# Patient Record
Sex: Female | Born: 1960 | Race: White | Hispanic: No | Marital: Married | Smoking: Never smoker
Health system: Southern US, Community
[De-identification: ages and names within clinical notes are randomized; demographics above are authoritative.]

## PROBLEM LIST (undated history)

## (undated) ENCOUNTER — Emergency Department: Discharge status: Absent without leave | Payer: Self-pay

## (undated) DIAGNOSIS — Z789 Other specified health status: Secondary | ICD-10-CM

## (undated) HISTORY — DX: Other specified health status: Z78.9

---
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
## 2020-02-02 ENCOUNTER — Emergency Department (INDEPENDENT_AMBULATORY_CARE_PROVIDER_SITE_OTHER)
Admission: EM | Admit: 2020-02-02 | Discharge: 2020-02-02 | Disposition: A | Discharge status: Home / Self Care | Payer: Managed Care, Other (non HMO) | Source: Home / Self Care

## 2020-02-02 ENCOUNTER — Emergency Department (INDEPENDENT_AMBULATORY_CARE_PROVIDER_SITE_OTHER): Payer: Managed Care, Other (non HMO)

## 2020-02-02 ENCOUNTER — Other Ambulatory Visit: Payer: Self-pay

## 2020-02-02 DIAGNOSIS — S92335A Nondisplaced fracture of third metatarsal bone, left foot, initial encounter for closed fracture: Secondary | ICD-10-CM

## 2020-02-02 DIAGNOSIS — W19XXXA Unspecified fall, initial encounter: Secondary | ICD-10-CM

## 2020-02-02 DIAGNOSIS — S82832A Other fracture of upper and lower end of left fibula, initial encounter for closed fracture: Secondary | ICD-10-CM

## 2020-02-02 DIAGNOSIS — S8265XA Nondisplaced fracture of lateral malleolus of left fibula, initial encounter for closed fracture: Secondary | ICD-10-CM | POA: Diagnosis not present

## 2020-02-02 NOTE — ED Triage Notes (Signed)
Patient presents to Urgent Care with complaints of left ankle pain since twisting it laterally and falling earlier today. Patient reports she is not sure what she stepped on, has been using her husband's cane to ambulate, feels like she heard something pop at the time of injury.

## 2020-02-02 NOTE — Discharge Instructions (Signed)
Take ibuprofen 400 mg every 6 hours as needed for pain. Follow-up with orthopedic at [facility] tomorrow at their walk in clinic.

## 2020-02-02 NOTE — ED Provider Notes (Addendum)
[facility]    CSN: [id number] Arrival date & time: 02/02/20  1458      History   Chief Complaint Chief Complaint  Patient presents with   Ankle Injury    Left    HPI Rhonda Wilkins is a 59 y.o. female.   HPI Patient presents for evaluation injury involving left ankle. Reports she that she walking and subsequently inverted her left ankle with a twisting action and subsequently sustained a fall. She has had worsening pain, swelling, with weight bearing. Ambulating with a cane.  No prior fracture involving the left lower extremity. History reviewed. No pertinent past medical history.  There are no problems to display for this patient.   History reviewed. No pertinent surgical history.  OB History   No obstetric history on file.      Home Medications    Prior to Admission medications   Not on File    Family History Family History  Problem Relation Age of Onset   Cancer Mother    Healthy Father     Social History Social History   Tobacco Use   Smoking status: Never Smoker   Smokeless tobacco: Never Used  Building services engineer Use: Never used  Substance Use Topics   Alcohol use: Not Currently   Drug use: Not on file     Allergies   Latex   Review of Systems Review of Systems Pertinent negatives listed in HPI Physical Exam Triage Vital Signs ED Triage Vitals  Enc Vitals Group     BP 02/02/20 1520 130/87     Pulse Rate 02/02/20 1520 97     Resp 02/02/20 1520 16     Temp 02/02/20 1520 98.7 F (37.1 C)     Temp Source 02/02/20 1520 Oral     SpO2 02/02/20 1520 99 %     Weight --      Height 02/02/20 1519 5\' 2"  (1.575 m)     Head Circumference --      Peak Flow --      Pain Score 02/02/20 1518 3     Pain Loc --      Pain Edu? --      Excl. in GC? --    No data found.  Updated Vital Signs BP 130/87 (BP Location: Right Arm)    Pulse 97    Temp 98.7 F (37.1 C) (Oral)    Resp 16    Ht 5\' 2"  (1.575 m)    SpO2 99%    Visual Acuity Right Eye Distance:   Left Eye Distance:   Bilateral Distance:    Right Eye Near:   Left Eye Near:    Bilateral Near:     Physical Exam General appearance: alert, well developed, well nourished, cooperative and in no distress Head: Normocephalic, without obvious abnormality, atraumatic Respiratory: Respirations even and unlabored, normal respiratory rate Heart: rate and rhythm normal. No gallop or murmurs noted on exam  Extremities: Left lower leg swelling, ecchymosis, tenderness  Skin: Skin color, texture, turgor normal. No rashes seen  Psych: Appropriate mood and affect. Neurologic: Mental status: Alert, oriented to person, place, and time, thought content appropriate.   UC Treatments / Results  Labs (all labs ordered are listed, but only abnormal results are displayed) Labs Reviewed - No data to display  EKG   Radiology DG Ankle Complete Left  Result Date: 02/02/2020 CLINICAL DATA:  Twisting injury to the left ankle today. Unable to bear weight. EXAM: LEFT  ANKLE COMPLETE - 3+ VIEW COMPARISON:  None. FINDINGS: Mildly comminuted fracture of the distal fibula, from the posterior distal shaft to the metaphysis, above the ankle synchondrosis and ankle joint. No other fractures. The ankle mortise is normally spaced and aligned. Lateral soft tissue swelling. IMPRESSION: 1. Nondisplaced, mildly comminuted fracture of the distal fibula as described. 2. Normally aligned ankle joint. Electronically Signed   By: Amie Portland M.D.   On: 02/02/2020 15:52   DG Foot Complete Left  Result Date: 02/02/2020 CLINICAL DATA:  Twisting injury to the left ankle today. Unable to bear weight. EXAM: LEFT FOOT - COMPLETE 3+ VIEW COMPARISON:  None. FINDINGS: Distal fibular fracture, described under the left ankle radiographs. On the oblique view, there is a nondisplaced, non comminuted fracture at the base of the third metatarsal. This appears extra-articular. No other fractures.  Joints  are normally spaced and aligned. Small plantar calcaneal spur. Soft tissues of the foot are unremarkable. IMPRESSION: 1. Nondisplaced, non comminuted fracture of the base of the third metatarsal, visualized on one view only, extra-articular. 2. Distal left fibular fractures as detailed under the left ankle radiographs. 3. No other fractures.  No dislocation. Electronically Signed   By: Amie Portland M.D.   On: 02/02/2020 15:54   Personally Reviewed Films, review with patient, Joaquin Courts, FNP-C   Procedures Procedures (including critical care time)  Medications Ordered in UC Medications - No data to display  Initial Impression / Assessment and Plan / UC Course  I have reviewed the triage vital signs and the nursing notes.  Pertinent labs & imaging results that were available during my care of the patient were reviewed by me and considered in my medical decision making (see chart for details).     Left distal fibula fracture and Left 3rd metatarsal fracture. Remain nonweightbearing. Placed in cam walker, crutches. Ice when out of boot. Wear boot with all weightbearing activities. Follow-up [facility] orthopedic walk in clinic tomorrow or further evaluation of distal fib fracture. Ibuprofen for pain. Pt declined pain medicine. Final Clinical Impressions(s) / UC Diagnoses   Final diagnoses:  Closed nondisplaced fracture of third metatarsal bone of left foot, initial encounter  Other closed fracture of distal end of left fibula, initial encounter     Discharge Instructions     Take ibuprofen 400 mg every 6 hours as needed for pain. Follow-up with orthopedic at [facility] tomorrow at their walk in clinic.    ED Prescriptions    None     PDMP not reviewed this encounter.   Bing Neighbors, FNP 02/04/20 1404    Bing Neighbors, FNP 02/04/20 1407    Bing Neighbors, FNP 02/06/20 1255

## 2020-02-04 ENCOUNTER — Other Ambulatory Visit: Payer: Self-pay

## 2020-02-04 ENCOUNTER — Ambulatory Visit (INDEPENDENT_AMBULATORY_CARE_PROVIDER_SITE_OTHER): Payer: Managed Care, Other (non HMO)

## 2020-02-04 ENCOUNTER — Ambulatory Visit (INDEPENDENT_AMBULATORY_CARE_PROVIDER_SITE_OTHER): Payer: Managed Care, Other (non HMO) | Admitting: Sports Medicine

## 2020-02-04 DIAGNOSIS — S92335A Nondisplaced fracture of third metatarsal bone, left foot, initial encounter for closed fracture: Secondary | ICD-10-CM | POA: Diagnosis not present

## 2020-02-04 DIAGNOSIS — W19XXXA Unspecified fall, initial encounter: Secondary | ICD-10-CM

## 2020-02-04 DIAGNOSIS — S8292XA Unspecified fracture of left lower leg, initial encounter for closed fracture: Secondary | ICD-10-CM

## 2020-02-04 DIAGNOSIS — S8265XA Nondisplaced fracture of lateral malleolus of left fibula, initial encounter for closed fracture: Secondary | ICD-10-CM | POA: Diagnosis not present

## 2020-02-04 MED ORDER — CALCIUM CARBONATE-VITAMIN D 600-400 MG-UNIT PO TABS: 1.0000 | ORAL_TABLET | Freq: Two times a day (BID) | ORAL | 11 refills | Status: DC

## 2020-02-04 MED ORDER — HYDROCODONE-ACETAMINOPHEN 5-325 MG PO TABS: 1.0000 | ORAL_TABLET | Freq: Three times a day (TID) | ORAL | 0 refills | Status: DC | PRN

## 2020-02-04 NOTE — Assessment & Plan Note (Addendum)
Nondisplaced fracture of the left third metatarsal base, comminuted fracture, nondisplaced of the left distal fibular shaft, continue boot. I discussed the physiology of bone healing. Ibuprofen seems to be working well for her, continue strict nonweightbearing with 2 crutches. X-rays today, x-rays at a 1 week follow-up visit. Once I see bony callus we can space out the imaging, and once she is no longer tender over the fracture we can start partial weightbearing as tolerated. We do expect two to 2-1/2 months for full healing of this severe injury. Hydrocodone for pain.

## 2020-02-04 NOTE — Addendum Note (Signed)
Addended by: Monica Becton on: 02/04/2020 02:48 PM   Modules accepted: Orders

## 2020-02-04 NOTE — Progress Notes (Addendum)
    Procedures performed today:    None.  Independent interpretation of notes and tests performed by another provider:   X-rays personally viewed, there is a nondisplaced fracture of the left third metatarsal base, comminuted fracture, nondisplaced of the left distal fibular shaft.  Brief History, Exam, Impression, and Recommendations:    Nondisplaced fracture of the left third metatarsal base, comminuted fracture, nondisplaced of the left distal fibular shaft Nondisplaced fracture of the left third metatarsal base, comminuted fracture, nondisplaced of the left distal fibular shaft, continue boot. I discussed the physiology of bone healing. Ibuprofen seems to be working well for her, continue strict nonweightbearing with 2 crutches. X-rays today, x-rays at a 1 week follow-up visit. Once I see bony callus we can space out the imaging, and once she is no longer tender over the fracture we can start partial weightbearing as tolerated. We do expect two to 2-1/2 months for full healing of this severe injury. Hydrocodone for pain.    ___________________________________________ [name]. [name], M.D., ABFM., CAQSM. Primary Care and Sports Medicine [facility] [facility]  Adjunct Instructor of Family Medicine  [employer]

## 2020-02-06 ENCOUNTER — Telehealth: Payer: Self-pay

## 2020-02-06 NOTE — Telephone Encounter (Signed)
Patient recently broke her ankle and foot and is walking on crutches.  She reports pulling a muscle in her ribs and is requesting a "wrap" to help this.  She is willing to come to the office to get something.  Please advise.

## 2020-02-07 ENCOUNTER — Ambulatory Visit (INDEPENDENT_AMBULATORY_CARE_PROVIDER_SITE_OTHER): Payer: Managed Care, Other (non HMO) | Admitting: Sports Medicine

## 2020-02-07 ENCOUNTER — Other Ambulatory Visit: Payer: Self-pay

## 2020-02-07 DIAGNOSIS — S8292XA Unspecified fracture of left lower leg, initial encounter for closed fracture: Secondary | ICD-10-CM

## 2020-02-07 DIAGNOSIS — S39011A Strain of muscle, fascia and tendon of abdomen, initial encounter: Secondary | ICD-10-CM | POA: Insufficient documentation

## 2020-02-07 MED ORDER — AMBULATORY NON FORMULARY MEDICATION: 0 refills | Status: DC

## 2020-02-07 NOTE — Assessment & Plan Note (Signed)
Rhonda Wilkins has been using crutches, she has developed pain just inferior to her right costal margin along her obliques. I think she likely just has an oblique strain. Her abdominal exam is unremarkable. Adding a rolling knee scooter instead of crutches, keep previously agreed upon follow-up.

## 2020-02-07 NOTE — Telephone Encounter (Signed)
Patient has an appointment with Dr [name] today.

## 2020-02-07 NOTE — Progress Notes (Signed)
    Procedures performed today:    None.  Independent interpretation of notes and tests performed by another provider:   None.  Brief History, Exam, Impression, and Recommendations:    Strain of abdominal muscle Rhonda Wilkins has been using crutches, she has developed pain just inferior to her right costal margin along her obliques. I think she likely just has an oblique strain. Her abdominal exam is unremarkable. Adding a rolling knee scooter instead of crutches, keep previously agreed upon follow-up.    ___________________________________________ [name]. [name], M.D., ABFM., CAQSM. Primary Care and Sports Medicine [place] [facility]  Adjunct Instructor of Family Medicine  [employer]

## 2020-02-11 ENCOUNTER — Ambulatory Visit (INDEPENDENT_AMBULATORY_CARE_PROVIDER_SITE_OTHER): Payer: Managed Care, Other (non HMO)

## 2020-02-11 ENCOUNTER — Other Ambulatory Visit: Payer: Self-pay

## 2020-02-11 ENCOUNTER — Ambulatory Visit (INDEPENDENT_AMBULATORY_CARE_PROVIDER_SITE_OTHER): Payer: Managed Care, Other (non HMO) | Admitting: Sports Medicine

## 2020-02-11 DIAGNOSIS — S39011D Strain of muscle, fascia and tendon of abdomen, subsequent encounter: Secondary | ICD-10-CM

## 2020-02-11 DIAGNOSIS — S8292XA Unspecified fracture of left lower leg, initial encounter for closed fracture: Secondary | ICD-10-CM

## 2020-02-11 DIAGNOSIS — S8265XD Nondisplaced fracture of lateral malleolus of left fibula, subsequent encounter for closed fracture with routine healing: Secondary | ICD-10-CM | POA: Diagnosis not present

## 2020-02-11 NOTE — Assessment & Plan Note (Signed)
Rhonda Wilkins returns, she is a pleasant 59 year old female, approximately 1 week post comminuted fracture of her fibular shaft as well as a nondisplaced fracture of her left third metatarsal base, she is doing well in a boot, still has significant pain over the fracture, although improved. She did need to place a sock on her heel and this took off some pressure over her fibular fracture, we will add a heel lift to help this. I had like to see her back in about 2 weeks, this will take her to about 3 weeks post injury, x-ray before visit.

## 2020-02-11 NOTE — Assessment & Plan Note (Signed)
Due to walking with crutches, we switched to a rolling knee scooter and this has improved considerably.

## 2020-02-11 NOTE — Progress Notes (Signed)
    Procedures performed today:    None.  Independent interpretation of notes and tests performed by another provider:   X-rays personally reviewed, comminuted fracture of the fibular shaft is still visible but stable.  Brief History, Exam, Impression, and Recommendations:    Nondisplaced fracture of the left third metatarsal base, comminuted fracture, nondisplaced of the left distal fibular shaft Rhonda Wilkins returns, she is a pleasant 59 year old female, approximately 1 week post comminuted fracture of her fibular shaft as well as a nondisplaced fracture of her left third metatarsal base, she is doing well in a boot, still has significant pain over the fracture, although improved. She did need to place a sock on her heel and this took off some pressure over her fibular fracture, we will add a heel lift to help this. I had like to see her back in about 2 weeks, this will take her to about 3 weeks post injury, x-ray before visit.  Strain of abdominal muscle Due to walking with crutches, we switched to a rolling knee scooter and this has improved considerably.    ___________________________________________ [name]. [name], M.D., ABFM., CAQSM. Primary Care and Sports Medicine [place] [facility]  Adjunct Instructor of Family Medicine  [employer]

## 2020-02-25 ENCOUNTER — Ambulatory Visit (INDEPENDENT_AMBULATORY_CARE_PROVIDER_SITE_OTHER): Payer: Managed Care, Other (non HMO)

## 2020-02-25 ENCOUNTER — Encounter: Payer: Self-pay | Admitting: Sports Medicine

## 2020-02-25 ENCOUNTER — Ambulatory Visit (INDEPENDENT_AMBULATORY_CARE_PROVIDER_SITE_OTHER): Payer: Managed Care, Other (non HMO) | Admitting: Sports Medicine

## 2020-02-25 ENCOUNTER — Other Ambulatory Visit: Payer: Self-pay

## 2020-02-25 DIAGNOSIS — S8292XA Unspecified fracture of left lower leg, initial encounter for closed fracture: Secondary | ICD-10-CM

## 2020-02-25 DIAGNOSIS — S8265XD Nondisplaced fracture of lateral malleolus of left fibula, subsequent encounter for closed fracture with routine healing: Secondary | ICD-10-CM | POA: Diagnosis not present

## 2020-02-25 DIAGNOSIS — S92335D Nondisplaced fracture of third metatarsal bone, left foot, subsequent encounter for fracture with routine healing: Secondary | ICD-10-CM | POA: Diagnosis not present

## 2020-02-25 NOTE — Progress Notes (Signed)
    Procedures performed today:    None.  Independent interpretation of notes and tests performed by another provider:   X-rays personally reviewed, fibular fracture appears stable, only minimal bony callus at this juncture.  Brief History, Exam, Impression, and Recommendations:    Nondisplaced fracture of the left third metatarsal base, comminuted fracture, nondisplaced of the left distal fibular shaft Rhonda Wilkins returns, she is a pleasant 59 year old female now approximately 3 weeks post comminuted fracture of her fibular shaft and nondisplaced fracture of the left third metatarsal base. Doing well in a boot, her pain is improved over both fracture sites. I did review her x-rays as above. Continue boot for now, she can do partial weightbearing with a single crutch, she will trace out the alphabet every night. She can return to see me in 3 weeks, x-ray of the foot and ankle before visit. She can also MyChart message me if she needs a higher dose or refill of her narcotic.    ___________________________________________ [name]. [name], M.D., ABFM., CAQSM. Primary Care and Sports Medicine [place] [facility]  Adjunct Instructor of Family Medicine  [employer]

## 2020-02-25 NOTE — Assessment & Plan Note (Signed)
Rhonda Wilkins returns, she is a pleasant 59 year old female now approximately 3 weeks post comminuted fracture of her fibular shaft and nondisplaced fracture of the left third metatarsal base. Doing well in a boot, her pain is improved over both fracture sites. I did review her x-rays as above. Continue boot for now, she can do partial weightbearing with a single crutch, she will trace out the alphabet every night. She can return to see me in 3 weeks, x-ray of the foot and ankle before visit. She can also MyChart message me if she needs a higher dose or refill of her narcotic.

## 2020-03-17 ENCOUNTER — Ambulatory Visit: Payer: Managed Care, Other (non HMO) | Admitting: Sports Medicine

## 2020-03-24 ENCOUNTER — Ambulatory Visit (INDEPENDENT_AMBULATORY_CARE_PROVIDER_SITE_OTHER): Payer: Managed Care, Other (non HMO) | Admitting: Sports Medicine

## 2020-03-24 ENCOUNTER — Ambulatory Visit (INDEPENDENT_AMBULATORY_CARE_PROVIDER_SITE_OTHER): Payer: Managed Care, Other (non HMO)

## 2020-03-24 ENCOUNTER — Other Ambulatory Visit: Payer: Self-pay

## 2020-03-24 DIAGNOSIS — S8292XA Unspecified fracture of left lower leg, initial encounter for closed fracture: Secondary | ICD-10-CM

## 2020-03-24 DIAGNOSIS — S8265XD Nondisplaced fracture of lateral malleolus of left fibula, subsequent encounter for closed fracture with routine healing: Secondary | ICD-10-CM | POA: Diagnosis not present

## 2020-03-24 DIAGNOSIS — S92335D Nondisplaced fracture of third metatarsal bone, left foot, subsequent encounter for fracture with routine healing: Secondary | ICD-10-CM

## 2020-03-24 NOTE — Progress Notes (Signed)
    Procedures performed today:    None.  Independent interpretation of notes and tests performed by another provider:   X-rays personally viewed, good bony callus around the comminuted fibular shaft fracture, also a little bony resorption and some bony callus around the third metatarsal base fracture.  Brief History, Exam, Impression, and Recommendations:    Nondisplaced fracture of the left third metatarsal base, comminuted fracture, nondisplaced of the left distal fibular shaft Rhonda Wilkins is now approximately 6 weeks post comminuted fracture of her fibular shaft as well as a nondisplaced fracture of the left third metatarsal base, she continues in the boot and is doing well. No tenderness over the fractures anymore, good motion, good strength. We actually had her weight-bear today and she did well. She can wear the boot for another 2 weeks, transition into an ASO. Weightbearing as tolerated after the initial 2-week period, okay to sleep out of the boot and in the ASO. Return to see me in 2 months.    ___________________________________________ [name]. [name], M.D., ABFM., CAQSM. Primary Care and Sports Medicine [place] [facility]  Adjunct Instructor of Family Medicine  [employer]

## 2020-03-24 NOTE — Assessment & Plan Note (Signed)
Arcelia is now approximately 6 weeks post comminuted fracture of her fibular shaft as well as a nondisplaced fracture of the left third metatarsal base, she continues in the boot and is doing well. No tenderness over the fractures anymore, good motion, good strength. We actually had her weight-bear today and she did well. She can wear the boot for another 2 weeks, transition into an ASO. Weightbearing as tolerated after the initial 2-week period, okay to sleep out of the boot and in the ASO. Return to see me in 2 months.

## 2020-04-20 ENCOUNTER — Ambulatory Visit (INDEPENDENT_AMBULATORY_CARE_PROVIDER_SITE_OTHER): Payer: Managed Care, Other (non HMO) | Admitting: Sports Medicine

## 2020-04-20 ENCOUNTER — Other Ambulatory Visit: Payer: Self-pay

## 2020-04-20 DIAGNOSIS — S8292XA Unspecified fracture of left lower leg, initial encounter for closed fracture: Secondary | ICD-10-CM | POA: Diagnosis not present

## 2020-04-20 NOTE — Progress Notes (Signed)
    Procedures performed today:    None.  Independent interpretation of notes and tests performed by another provider:   None.  Brief History, Exam, Impression, and Recommendations:    Nondisplaced fracture of the left third metatarsal base, comminuted fracture, nondisplaced of the left distal fibular shaft Rhonda Wilkins returns, she is a pleasant 60 year old female now approximately 9-1/2 weeks post comminuted fracture of her fibular shaft as well as a nondisplaced comminuted fracture of the left third metatarsal base, doing well. At the last visit we had recommended she only wear the boot for 2 weeks and transition into an ASO, she has continued in the boot. Today she has no tenderness over her fibular fracture, no tenderness over her third metatarsal base fracture, she will discontinue the boot, transition into an ASO weight-bear as tolerated. Return to see me as needed.    ___________________________________________ [name]. [name], M.D., ABFM., CAQSM. Primary Care and Sports Medicine [place] [facility]  Adjunct Instructor of Family Medicine  [employer] [facility]

## 2020-04-20 NOTE — Assessment & Plan Note (Signed)
Rhonda Wilkins returns, she is a pleasant 60 year old female now approximately 9-1/2 weeks post comminuted fracture of her fibular shaft as well as a nondisplaced comminuted fracture of the left third metatarsal base, doing well. At the last visit we had recommended she only wear the boot for 2 weeks and transition into an ASO, she has continued in the boot. Today she has no tenderness over her fibular fracture, no tenderness over her third metatarsal base fracture, she will discontinue the boot, transition into an ASO weight-bear as tolerated. Return to see me as needed.

## 2020-05-26 ENCOUNTER — Ambulatory Visit (INDEPENDENT_AMBULATORY_CARE_PROVIDER_SITE_OTHER): Payer: Managed Care, Other (non HMO)

## 2020-05-26 ENCOUNTER — Ambulatory Visit (INDEPENDENT_AMBULATORY_CARE_PROVIDER_SITE_OTHER): Payer: Managed Care, Other (non HMO) | Admitting: Sports Medicine

## 2020-05-26 ENCOUNTER — Other Ambulatory Visit: Payer: Self-pay

## 2020-05-26 DIAGNOSIS — S8292XA Unspecified fracture of left lower leg, initial encounter for closed fracture: Secondary | ICD-10-CM

## 2020-05-26 NOTE — Progress Notes (Signed)
    Procedures performed today:    None.  Independent interpretation of notes and tests performed by another provider:   X-rays personally viewed show good bony callus.  Brief History, Exam, Impression, and Recommendations:    Nondisplaced fracture of the left third metatarsal base, comminuted fracture, nondisplaced of the left distal fibular shaft Rhonda Wilkins returns, she is a pleasant 60 year old female, now approximately 40 months and 3 weeks post comminuted fibular fracture as well as nondisplaced comminuted left third metatarsal base fracture, doing really well, no tenderness over the fracture, able to hop up and down on the affected extremity. X-rays show good bony callus and good alignment, on the lateral view there is still some comminution, we will keep an eye out for evidence of nonunion but otherwise I think she can come back to see me on an as-needed basis.    ___________________________________________ [name]. [name], M.D., ABFM., CAQSM. Primary Care and Sports Medicine [place] [facility]  Adjunct Instructor of Family Medicine  [employer] [facility] [employer]

## 2020-05-26 NOTE — Assessment & Plan Note (Addendum)
Rhonda Wilkins returns, she is a pleasant 60 year old female, now approximately 40 months and 3 weeks post comminuted fibular fracture as well as nondisplaced comminuted left third metatarsal base fracture, doing really well, no tenderness over the fracture, able to hop up and down on the affected extremity. X-rays show good bony callus and good alignment, on the lateral view there is still some comminution, we will keep an eye out for evidence of nonunion but otherwise I think she can come back to see me on an as-needed basis.

## 2020-05-27 ENCOUNTER — Ambulatory Visit (INDEPENDENT_AMBULATORY_CARE_PROVIDER_SITE_OTHER): Payer: Managed Care, Other (non HMO)

## 2020-05-27 DIAGNOSIS — S8292XA Unspecified fracture of left lower leg, initial encounter for closed fracture: Secondary | ICD-10-CM | POA: Diagnosis not present

## 2020-09-24 NOTE — Telephone Encounter (Signed)
This encounter was created in error - please disregard.

## 2020-12-22 ENCOUNTER — Ambulatory Visit: Payer: Managed Care, Other (non HMO) | Admitting: Family Medicine

## 2021-01-11 ENCOUNTER — Ambulatory Visit (INDEPENDENT_AMBULATORY_CARE_PROVIDER_SITE_OTHER): Payer: Managed Care, Other (non HMO) | Admitting: Sports Medicine

## 2021-01-11 ENCOUNTER — Ambulatory Visit (INDEPENDENT_AMBULATORY_CARE_PROVIDER_SITE_OTHER): Payer: Managed Care, Other (non HMO)

## 2021-01-11 ENCOUNTER — Other Ambulatory Visit: Payer: Self-pay

## 2021-01-11 DIAGNOSIS — M5030 Other cervical disc degeneration, unspecified cervical region: Secondary | ICD-10-CM | POA: Insufficient documentation

## 2021-01-11 MED ORDER — PREDNISONE 50 MG PO TABS: ORAL_TABLET | ORAL | 0 refills | Status: DC

## 2021-01-11 NOTE — Assessment & Plan Note (Signed)
This is a pleasant 60 year old female, for the past several months she has had increasing numbness and tingling, achiness in the medial upper arm with radiation down to the fourth and fifth fingers with numbness and tingling, worse with gripping motions. Her exam is for the most part unremarkable with the exception of a 1+ triceps reflex. Negative Hoffmann signs bilaterally, good strength. She likely has cervical DDD, most likely secondary to cervical spinal stenosis. Adding x-rays, 5 days of prednisone, formal physical therapy. Return to see me in 6 weeks, MRI for interventional planning if no better.

## 2021-01-11 NOTE — Progress Notes (Signed)
    Procedures performed today:    None.  Independent interpretation of notes and tests performed by another provider:   None.  Brief History, Exam, Impression, and Recommendations:    DDD (degenerative disc disease), cervical This is a pleasant 60 year old female, for the past several months she has had increasing numbness and tingling, achiness in the medial upper arm with radiation down to the fourth and fifth fingers with numbness and tingling, worse with gripping motions. Her exam is for the most part unremarkable with the exception of a 1+ triceps reflex. Negative Hoffmann signs bilaterally, good strength. She likely has cervical DDD, most likely secondary to cervical spinal stenosis. Adding x-rays, 5 days of prednisone, formal physical therapy. Return to see me in 6 weeks, MRI for interventional planning if no better.    ___________________________________________ [name]. [name], M.D., ABFM., CAQSM. Primary Care and Sports Medicine  [facility]  Adjunct Instructor of Family Medicine  [employer]

## 2021-01-15 ENCOUNTER — Telehealth: Payer: Self-pay

## 2021-01-15 NOTE — Telephone Encounter (Signed)
Rhonda Wilkins called and left a message requesting a refill on prednisone. She states she is still having numbness and tingling. She has not been able to get in with PT.

## 2021-01-18 MED ORDER — PREDNISONE 20 MG PO TABS: 40.0000 mg | ORAL_TABLET | Freq: Every day | ORAL | 0 refills | Status: DC

## 2021-01-18 NOTE — Telephone Encounter (Signed)
New rx sent

## 2021-01-18 NOTE — Telephone Encounter (Signed)
Patient advised.

## 2021-01-25 ENCOUNTER — Ambulatory Visit (INDEPENDENT_AMBULATORY_CARE_PROVIDER_SITE_OTHER): Payer: Managed Care, Other (non HMO) | Admitting: Rehabilitative and Restorative Service Providers"

## 2021-01-25 ENCOUNTER — Encounter: Payer: Self-pay | Admitting: Rehabilitative and Restorative Service Providers"

## 2021-01-25 ENCOUNTER — Other Ambulatory Visit: Payer: Self-pay

## 2021-01-25 DIAGNOSIS — R29898 Other symptoms and signs involving the musculoskeletal system: Secondary | ICD-10-CM

## 2021-01-25 DIAGNOSIS — M6281 Muscle weakness (generalized): Secondary | ICD-10-CM | POA: Diagnosis not present

## 2021-01-25 DIAGNOSIS — R293 Abnormal posture: Secondary | ICD-10-CM

## 2021-01-25 DIAGNOSIS — M5030 Other cervical disc degeneration, unspecified cervical region: Secondary | ICD-10-CM

## 2021-01-25 NOTE — Patient Instructions (Signed)
Access Code: [email or web address] URL: [email or web address] Date: 01/25/2021 Prepared by: Corlis Leak  Exercises Seated Cervical Retraction - 3 x daily - 7 x weekly - 1 sets - 10 reps Standing Scapular Retraction - 3 x daily - 7 x weekly - 1 sets - 10 reps - 10 hold Shoulder External Rotation and Scapular Retraction - 3 x daily - 7 x weekly - 1 sets - 10 reps - 3-5 sec hold Shoulder External Rotation in 45 Degrees Abduction - 2 x daily - 7 x weekly - 1-2 sets - 10 reps - 3 sec hold Doorway Pec Stretch at 60 Degrees Abduction - 3 x daily - 7 x weekly - 1 sets - 3 reps Doorway Pec Stretch at 90 Degrees Abduction - 3 x daily - 7 x weekly - 1 sets - 3 reps - 30 seconds hold Doorway Pec Stretch at 120 Degrees Abduction - 3 x daily - 7 x weekly - 1 sets - 3 reps - 30 second hold hold  Patient Education Hospital doctor Office Posture

## 2021-01-25 NOTE — Therapy (Signed)
[facility] [place] [address] [place], [place], [address] Phone: [phone]   Fax:  [phone]  Physical Therapy Treatment  Patient Details  Name: Rhonda Wilkins MRN: [id number] Date of Birth: Mar 22, 1960 Referring Provider (PT): Dr [name]   Encounter Date: 01/25/2021   PT End of Session - 01/25/21 1346     Visit Number 1    Number of Visits 12    Date for PT Re-Evaluation 03/08/21    PT Start Time 1013    PT Stop Time 1102    PT Time Calculation (min) 49 min    Activity Tolerance Patient tolerated treatment well             Past Medical History:  Diagnosis Date   No pertinent past medical history     Past Surgical History:  Procedure Laterality Date   KNEE ARTHROSCOPY Left 1982    There were no vitals filed for this visit.   Subjective Assessment - 01/25/21 1018     Subjective Patient reports that she has had intermittent numbness in the 4th and 5th Lt UE in May 2022; she has had intermittent symptoms. August and September symptoms began to occur more frequently. She is now having compression and squeezing in both arms from mid humerus to hands/fingers. Symtpoms now happening once every week or two weeks lasting for ~ 5 min and then resolves. She sits to wait for symptoms to resolve. She was seen by Dr [name] and diagnosed cervical DDD. Top of her neck now feels uncomfortable now. Sits propped up in bed at times. She has tightness in the neck to the point the neck muscles are burning and sometimes a tingling sensation.    Pertinent History denies any medicatl problems    Currently in Pain? No/denies    Pain Score 0-No pain    Pain Location Neck    Pain Orientation Mid;Lower    Pain Descriptors / Indicators Burning;Tightness   stiffness sometimes stinging   Pain Type Chronic pain    Pain Radiating Towards symptoms into bilat UE's    Pain Onset More than a month ago    Pain Frequency Intermittent     Aggravating Factors  movement - not sure what movements create symptoms    Pain Relieving Factors rest                [facility] PT Assessment - 01/25/21 0001       Assessment   Medical Diagnosis Cervical DDD; UE radicular symptoms    Referring Provider (PT) Dr [name]    Onset Date/Surgical Date 06/28/20    Hand Dominance Right    Next MD Visit 02/23/21    Prior Therapy none      Precautions   Precautions None      Restrictions   Weight Bearing Restrictions No      Balance Screen   Has the patient fallen in the past 6 months No    Has the patient had a decrease in activity level because of a fear of falling?  No    Is the patient reluctant to leave their home because of a fear of falling?  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    Leisure household chores; volunteering in [place]; walking (has not walked since fx Lt LE 02/04/20); knitting      Observation/Other Assessments   Focus on Therapeutic Outcomes (FOTO)  50      Sensation   Additional Comments intiermittent symptoms bilat UE's including compression of arms; tingling into the hands/fingers especially 4th and 5th fingers      Posture/Postural Control   Posture Comments head forward; shoulders rounded and elevated; head of the humerus anterior in orientation      AROM   Right Shoulder Extension 65 Degrees    Right Shoulder Flexion 142 Degrees   tight upper trap   Right Shoulder ABduction 161 Degrees    Right Shoulder Internal Rotation --   thumb T6   Right Shoulder External Rotation 71 Degrees   tight upper trap   Left Shoulder Extension 51 Degrees    Left Shoulder Flexion 139 Degrees    Left Shoulder ABduction 162 Degrees    Left Shoulder Internal Rotation --    thumb T 5   Left Shoulder External Rotation 57 Degrees    Cervical Flexion 50    Cervical Extension 47    Cervical - Right Side Bend 28    Cervical - Left Side Bend 22    Cervical - Right Rotation 58    Cervical - Left Rotation 52      Strength   Overall Strength Comments decreased strength middle lower trap/postural muscles      Palpation   Spinal mobility hypomobile mid to upper thoracic and cervical spine with PA and lateral mobs    Palpation comment muscular tightness Rt > Lt ant/lat/posterior cervical musculature; pecs; upper traps; leveator; teres                           [facility] Adult PT Treatment/Exercise - 01/25/21 0001       Self-Care   Self-Care Other Self-Care Comments    Other Self-Care Comments  initiated postural education      Therapeutic Activites    Therapeutic Activities Other Therapeutic Activities    Other Therapeutic Activities myofacial ball release      Shoulder Exercises: Standing   Other Standing Exercises chin tuck 5 sec x 5 reps; scap squeeze 5 sec x 10; L's x 10; W's x 10 with noodle along spine      Shoulder Exercises: Stretch   Other Shoulder Stretches doorway 3 positions 20 sec hold x 1 res each position                     PT Education - 01/25/21 1100     Education Details HEP POC posture office ergonomics    Person(s) Educated Patient    Methods Explanation;Demonstration;Tactile cues;Verbal cues;Handout    Comprehension Verbalized understanding;Returned demonstration;Verbal cues required;Tactile cues required                 PT Long Term Goals - 01/25/21 1456       PT LONG TERM GOAL #1   Title Improve posture and alignment with patient to demonstrate improved upright posture with posterior shoulder girdle engaged (middle trap and lower trap strength 4+/5 to 5/5)    Time 6    Status New    Target Date 03/08/21      PT LONG TERM GOAL #2   Title Decrease frequency, intensity and duration of  radicular symptoms to allow patient to use UE's  for all functional activities    Time 6    Period Weeks    Status New    Target Date 03/08/21      PT LONG TERM GOAL #3   Title Increase cervical ROM and mobilty to WFL's in all planes without pain/tightness    Time 6    Period Weeks    Target Date 03/08/21      PT LONG TERM GOAL #4   Title Independent in HEP with patient to demonstrate and/or verbalize proper posture for rest and functional activities    Time 6    Period Weeks    Status New    Target Date 03/08/21      PT LONG TERM GOAL #5   Title Improve functional assessment score to 70    Time 6    Period Weeks    Status New    Target Date 03/08/21                   Plan - 01/25/21 1347     Clinical Impression Statement Patient presents with ~ 6 month history of bilat UE radicular symptoms including feelings of compression mid humerus and tingling into 4th and 5th fingers. Symptoms are intermittetn in nature and patient does not know what causes the symptoms.  Xray shows cervical DDD. Paitent presents clinically with poor posture aan alignment; limited cervical and thoracic mobilty/ROM; decreased UE ROM bilat shoulders; muscular tightness through the cervical and upper quarter musculature; radicular symptoms on an itermittent basis. Patient will benefit from PT to address problems identified.    Stability/Clinical Decision Making Stable/Uncomplicated    Clinical Decision Making Low    Rehab Potential Good    PT Frequency 2x / week    PT Duration 6 weeks    PT Treatment/Interventions ADLs/Self Care Home Management;Aquatic Therapy;Cryotherapy;Electrical Stimulation;Iontophoresis 4mg /ml Dexamethasone;Moist Heat;Ultrasound;Therapeutic activities;Therapeutic exercise;Balance training;Neuromuscular re-education;Patient/family education;Manual techniques;Passive range of motion;Dry needling    PT Next Visit Plan review and progress HEP; assess neural tension; trial of manual  work; discuss dry needling; trial of modalities as indicated    PT Home Exercise Plan [email or web address]    Consulted and Agree with Plan of Care Patient             Patient will benefit from skilled therapeutic intervention in order to improve the following deficits and impairments:  Increased fascial restricitons, Decreased range of motion, Decreased activity tolerance, Pain, Impaired UE functional use, Hypomobility, Impaired flexibility, Improper body mechanics, Decreased mobility, Decreased strength, Impaired sensation, Postural dysfunction  Visit Diagnosis: DDD (degenerative disc disease), cervical  Other symptoms and signs involving the musculoskeletal system  Abnormal posture  Muscle weakness (generalized)     Problem List Patient Active Problem List   Diagnosis Date Noted   DDD (degenerative disc disease), cervical 01/11/2021   Strain of abdominal muscle 02/07/2020   Nondisplaced fracture of the left third metatarsal base, comminuted fracture, nondisplaced of the left distal fibular shaft 02/04/2020    Rivaldo Hineman 14/08/2019, PT, MPH  01/25/2021, 3:02 PM  [facility] [address] [place], [place], [place] Phone: [phone]   Fax:  [phone]  Name: Ashlee Player MRN: [name] Date of Birth: 05-12-1960

## 2021-01-27 ENCOUNTER — Encounter: Payer: Self-pay | Admitting: Rehabilitative and Restorative Service Providers"

## 2021-01-27 ENCOUNTER — Ambulatory Visit (INDEPENDENT_AMBULATORY_CARE_PROVIDER_SITE_OTHER): Payer: Managed Care, Other (non HMO) | Admitting: Rehabilitative and Restorative Service Providers"

## 2021-01-27 ENCOUNTER — Other Ambulatory Visit: Payer: Self-pay

## 2021-01-27 DIAGNOSIS — M5030 Other cervical disc degeneration, unspecified cervical region: Secondary | ICD-10-CM | POA: Diagnosis not present

## 2021-01-27 DIAGNOSIS — M6281 Muscle weakness (generalized): Secondary | ICD-10-CM | POA: Diagnosis not present

## 2021-01-27 DIAGNOSIS — R29898 Other symptoms and signs involving the musculoskeletal system: Secondary | ICD-10-CM

## 2021-01-27 DIAGNOSIS — R293 Abnormal posture: Secondary | ICD-10-CM | POA: Diagnosis not present

## 2021-01-27 NOTE — Therapy (Signed)
[facility] [place] [address] [place], [place], [address] Phone: [phone]   Fax:  [phone]  Physical Therapy Treatment  Patient Details  Name: Rhonda Wilkins MRN: [id number] Date of Birth: April 30, 1960 Referring Provider (PT): Dr [name]   Encounter Date: 01/27/2021   PT End of Session - 01/27/21 0714     Visit Number 2    Number of Visits 12    Date for PT Re-Evaluation 03/08/21    PT Start Time 0715    PT Stop Time 0804    PT Time Calculation (min) 49 min    Activity Tolerance Patient tolerated treatment well             Past Medical History:  Diagnosis Date   No pertinent past medical history     Past Surgical History:  Procedure Laterality Date   KNEE ARTHROSCOPY Left 1982    There were no vitals filed for this visit.   Subjective Assessment - 01/27/21 0716     Subjective Patient reports that the numbing in the hands is still there as well as the compression in the arms.    Currently in Pain? No/denies    Pain Score 0-No pain                               [facility] Adult PT Treatment/Exercise - 01/27/21 0001       Self-Care   Other Self-Care Comments  continued with postural education and education      Shoulder Exercises: Standing   Other Standing Exercises chin tuck 5 sec x 5 reps; scap squeeze 5 sec x 10; L's x 10; W's x 10 with noodle along spine      Shoulder Exercises: Stretch   Other Shoulder Stretches doorway 3 positions 20 sec hold x 2 reps each position    Other Shoulder Stretches nerve stretch supine; prolonged snow angel ~ 2 min arms at ~ 90 deg      Moist Heat Therapy   Number Minutes Moist Heat 10 Minutes    Moist Heat Location Cervical;Shoulder      Electrical Stimulation   Electrical Stimulation Location bilat cervical; upper traps    Electrical Stimulation Action TENS    Electrical Stimulation Parameters to tolerance    Electrical Stimulation  Goals Tone      Manual Therapy   Soft tissue mobilization deep tissue work to tolerance through ant/lat/posterior cervical and upper trap musculature; pecs; arms bilat    Myofascial Release anterior chest    Neural Stretch nerve stretch 1 min x 2 reps Rt UE 1 min x 1 rep Lt UE                     PT Education - 01/27/21 0739     Education Details HEP TENS    Person(s) Educated Patient    Methods Explanation;Demonstration;Tactile cues;Verbal cues;Handout    Comprehension Verbalized understanding;Returned demonstration;Verbal cues required;Tactile cues required                 PT Long Term Goals - 01/25/21 1456       PT LONG TERM GOAL #1   Title Improve posture and alignment with patient to demonstrate improved upright posture with posterior shoulder girdle engaged (middle trap and lower trap strength 4+/5 to 5/5)    Time 6    Status New    Target Date 03/08/21  PT LONG TERM GOAL #2   Title Decrease frequency, intensity and duration of radicular symptoms to allow patient to use UE's for all functional activities    Time 6    Period Weeks    Status New    Target Date 03/08/21      PT LONG TERM GOAL #3   Title Increase cervical ROM and mobilty to WFL's in all planes without pain/tightness    Time 6    Period Weeks    Target Date 03/08/21      PT LONG TERM GOAL #4   Title Independent in HEP with patient to demonstrate and/or verbalize proper posture for rest and functional activities    Time 6    Period Weeks    Status New    Target Date 03/08/21      PT LONG TERM GOAL #5   Title Improve functional assessment score to 70    Time 6    Period Weeks    Status New    Target Date 03/08/21                   Plan - 01/27/21 0718     Clinical Impression Statement Reviewed exercises correcting as indicated. Trial of manual work through the cervical and thoracic spine. Trial of TENS unit for possible purchase for home use. Good response to  treatment.    Rehab Potential Good    PT Frequency 2x / week    PT Duration 6 weeks    PT Treatment/Interventions ADLs/Self Care Home Management;Aquatic Therapy;Cryotherapy;Electrical Stimulation;Iontophoresis 4mg /ml Dexamethasone;Moist Heat;Ultrasound;Therapeutic activities;Therapeutic exercise;Balance training;Neuromuscular re-education;Patient/family education;Manual techniques;Passive range of motion;Dry needling    PT Next Visit Plan review and progress HEP; add neural stretch; assess response to manual work and TENS; discuss dry needling; trial of modalities as indicated    PT Home Exercise Plan [email or web address]    Consulted and Agree with Plan of Care Patient             Patient will benefit from skilled therapeutic intervention in order to improve the following deficits and impairments:     Visit Diagnosis: DDD (degenerative disc disease), cervical  Other symptoms and signs involving the musculoskeletal system  Abnormal posture  Muscle weakness (generalized)     Problem List Patient Active Problem List   Diagnosis Date Noted   DDD (degenerative disc disease), cervical 01/11/2021   Strain of abdominal muscle 02/07/2020   Nondisplaced fracture of the left third metatarsal base, comminuted fracture, nondisplaced of the left distal fibular shaft 02/04/2020    Mekayla Soman 14/08/2019, PT, MPH 01/27/2021, 8:02 AM  [facility] [address] [place], [place], [place] Phone: [phone]   Fax:  [phone]  Name: Kana Reimann MRN: [name] Date of Birth: 09-17-1960

## 2021-01-27 NOTE — Patient Instructions (Addendum)

## 2021-02-02 ENCOUNTER — Encounter: Payer: Self-pay | Admitting: Rehabilitative and Restorative Service Providers"

## 2021-02-02 ENCOUNTER — Other Ambulatory Visit: Payer: Self-pay

## 2021-02-02 ENCOUNTER — Ambulatory Visit (INDEPENDENT_AMBULATORY_CARE_PROVIDER_SITE_OTHER): Payer: Managed Care, Other (non HMO) | Admitting: Rehabilitative and Restorative Service Providers"

## 2021-02-02 DIAGNOSIS — M6281 Muscle weakness (generalized): Secondary | ICD-10-CM

## 2021-02-02 DIAGNOSIS — R29898 Other symptoms and signs involving the musculoskeletal system: Secondary | ICD-10-CM | POA: Diagnosis not present

## 2021-02-02 DIAGNOSIS — R293 Abnormal posture: Secondary | ICD-10-CM

## 2021-02-02 DIAGNOSIS — M5030 Other cervical disc degeneration, unspecified cervical region: Secondary | ICD-10-CM | POA: Diagnosis not present

## 2021-02-02 NOTE — Therapy (Signed)
[facility] [place] [address] [place], [place], [address] Phone: [phone]   Fax:  [phone]  Physical Therapy Treatment  Patient Details  Name: Rhonda Wilkins MRN: [id number] Date of Birth: January 29, 1961 Referring Provider (PT): Dr [name]   Encounter Date: 02/02/2021   PT End of Session - 02/02/21 0803     Visit Number 3    Number of Visits 12    Date for PT Re-Evaluation 03/08/21    PT Start Time 0802    PT Stop Time 0853    PT Time Calculation (min) 51 min    Activity Tolerance Patient tolerated treatment well             Past Medical History:  Diagnosis Date   No pertinent past medical history     Past Surgical History:  Procedure Laterality Date   KNEE ARTHROSCOPY Left 1982    There were no vitals filed for this visit.   Subjective Assessment - 02/02/21 0803     Subjective Good response to manual work. She has had less "compression" in arms. She still has the "numbing" feeling in both arms. Neck felt looser after initial treatment.    Currently in Pain? No/denies    Pain Score 0-No pain    Pain Location Neck    Pain Orientation Mid;Lower    Pain Descriptors / Indicators Burning    Pain Type Chronic pain                [facility] PT Assessment - 02/02/21 0001       Assessment   Medical Diagnosis Cervical DDD; UE radicular symptoms    Referring Provider (PT) Dr [name]    Onset Date/Surgical Date 06/28/20    Hand Dominance Right    Next MD Visit 02/23/21    Prior Therapy none      Palpation   Palpation comment persistent muscular tightness Rt > Lt ant/lat/posterior cervical musculature; pecs; upper traps; leveator; teres                           [facility] Adult PT Treatment/Exercise - 02/02/21 0001       Self-Care   Other Self-Care Comments  postural education and education; transfers and transitional movements      Shoulder Exercises: Standing   Extension  Strengthening;Both;10 reps;Theraband    Theraband Level (Shoulder Extension) Level 3 (Green)    Row Strengthening;Both;10 reps;Theraband    Theraband Level (Shoulder Row) Level 3 (Green)    Retraction Strengthening;Both;10 reps;Theraband    Theraband Level (Shoulder Retraction) Level 1 (Yellow)    Other Standing Exercises chin tuck 5 sec x 5 reps; scap squeeze 5 sec x 10; L's x 10; W's x 10 with noodle along spine      Shoulder Exercises: Stretch   Other Shoulder Stretches doorway 3 positions 20 sec hold x 2 reps each position    Other Shoulder Stretches nerve stretch supine; prolonged snow angel ~ 2 min arms at ~ 90 deg      Moist Heat Therapy   Number Minutes Moist Heat 10 Minutes    Moist Heat Location Cervical;Shoulder      Electrical Stimulation   Electrical Stimulation Location bilat cervical; upper traps    Electrical Stimulation Action TENS    Electrical Stimulation Parameters to tolerance    Electrical Stimulation Goals Tone      Manual Therapy   Joint Mobilization cervical and upper thoracic PA mobs  Grade II/III    Soft tissue mobilization deep tissue work to tolerance through ant/lat/posterior cervical and upper trap musculature; pecs; arms bilat    Myofascial Release anterior chest    Passive ROM lateral cervical flexion; cervical flexion 10-15 sec hold x 2 each position    Manual Traction cervical traction 10-15 sec throughout treatment 3-4 reps 10-15 sec hold                     PT Education - 02/02/21 0813     Education Details DN; HEP    Person(s) Educated Patient    Methods Explanation;Demonstration;Tactile cues;Verbal cues;Handout    Comprehension Verbalized understanding;Returned demonstration;Verbal cues required;Tactile cues required                 PT Long Term Goals - 01/25/21 1456       PT LONG TERM GOAL #1   Title Improve posture and alignment with patient to demonstrate improved upright posture with posterior shoulder girdle  engaged (middle trap and lower trap strength 4+/5 to 5/5)    Time 6    Status New    Target Date 03/08/21      PT LONG TERM GOAL #2   Title Decrease frequency, intensity and duration of radicular symptoms to allow patient to use UE's for all functional activities    Time 6    Period Weeks    Status New    Target Date 03/08/21      PT LONG TERM GOAL #3   Title Increase cervical ROM and mobilty to WFL's in all planes without pain/tightness    Time 6    Period Weeks    Target Date 03/08/21      PT LONG TERM GOAL #4   Title Independent in HEP with patient to demonstrate and/or verbalize proper posture for rest and functional activities    Time 6    Period Weeks    Status New    Target Date 03/08/21      PT LONG TERM GOAL #5   Title Improve functional assessment score to 70    Time 6    Period Weeks    Status New    Target Date 03/08/21                   Plan - 02/02/21 0809     Clinical Impression Statement Positive response to manual work through the cervical and thoracic areas. Patient has less intense radicular symptoms. She has continued tightness through the ant/lat/posterior cervical musculature into pecs/upper traps. Good response to treatment.    Rehab Potential Good    PT Frequency 2x / week    PT Duration 6 weeks    PT Treatment/Interventions ADLs/Self Care Home Management;Aquatic Therapy;Cryotherapy;Electrical Stimulation;Iontophoresis 4mg /ml Dexamethasone;Moist Heat;Ultrasound;Therapeutic activities;Therapeutic exercise;Balance training;Neuromuscular re-education;Patient/family education;Manual techniques;Passive range of motion;Dry needling    PT Next Visit Plan review and progress HEP; add neural stretch; continue manual work; dry needling; TENS/modalities as indicated    PT Home Exercise Plan [email or web address]    Consulted and Agree with Plan of Care Patient             Patient will benefit from skilled therapeutic intervention in order to improve the  following deficits and impairments:     Visit Diagnosis: DDD (degenerative disc disease), cervical  Other symptoms and signs involving the musculoskeletal system  Abnormal posture  Muscle weakness (generalized)     Problem List Patient Active Problem List   Diagnosis Date  Noted   DDD (degenerative disc disease), cervical 01/11/2021   Strain of abdominal muscle 02/07/2020   Nondisplaced fracture of the left third metatarsal base, comminuted fracture, nondisplaced of the left distal fibular shaft 02/04/2020    Laquitta Dominski Rober Minion, PT, MPH 02/02/2021, 9:11 AM  [facility] [address] [place] [address] [place], [place], [address] Phone: [phone]   Fax:  [phone]  Name: Jyla Hopf MRN: [id number] Date of Birth: December 26, 1960

## 2021-02-02 NOTE — Patient Instructions (Addendum)
Access Code: [email or web address] URL: [email or web address] Date: 02/02/2021 Prepared by: Corlis Leak  Exercises Seated Cervical Retraction - 3 x daily - 7 x weekly - 1 sets - 10 reps Standing Scapular Retraction - 3 x daily - 7 x weekly - 1 sets - 10 reps - 10 hold Shoulder External Rotation and Scapular Retraction - 3 x daily - 7 x weekly - 1 sets - 10 reps - 3-5 sec hold Shoulder External Rotation in 45 Degrees Abduction - 2 x daily - 7 x weekly - 1-2 sets - 10 reps - 3 sec hold Doorway Pec Stretch at 60 Degrees Abduction - 3 x daily - 7 x weekly - 1 sets - 3 reps Doorway Pec Stretch at 90 Degrees Abduction - 3 x daily - 7 x weekly - 1 sets - 3 reps - 30 seconds hold Doorway Pec Stretch at 120 Degrees Abduction - 3 x daily - 7 x weekly - 1 sets - 3 reps - 30 second hold hold Shoulder External Rotation and Scapular Retraction with Resistance - 2 x daily - 7 x weekly - 3 sets - 10 reps - 3 hold Scapular Retraction with Resistance - 2 x daily - 7 x weekly - 3 sets - 10 reps - 3 hold Scapular Retraction with Resistance Advanced - 2 x daily - 7 x weekly - 3 sets - 10 reps - 3 hold Shoulder External Rotation with Resistance - 2 x daily - 7 x weekly - 1 sets - 10 reps - 3 sec hold   Trigger Point Dry Needling  What is Trigger Point Dry Needling (DN)? DN is a physical therapy technique used to treat muscle pain and dysfunction. Specifically, DN helps deactivate muscle trigger points (muscle knots).  A thin filiform needle is used to penetrate the skin and stimulate the underlying trigger point. The goal is for a local twitch response (LTR) to occur and for the trigger point to relax. No medication of any kind is injected during the procedure.   What Does Trigger Point Dry Needling Feel Like?  The procedure feels different for each individual patient. Some patients report that they do not actually feel the needle enter the skin and overall the process is not painful. Very mild bleeding may  occur. However, many patients feel a deep cramping in the muscle in which the needle was inserted. This is the local twitch response.   How Will I feel after the treatment? Soreness is normal, and the onset of soreness may not occur for a few hours. Typically this soreness does not last longer than two days.  Bruising is uncommon, however; ice can be used to decrease any possible bruising.  In rare cases feeling tired or nauseous after the treatment is normal. In addition, your symptoms may get worse before they get better, this period will typically not last longer than 24 hours.   What Can I do After My Treatment? Increase your hydration by drinking more water for the next 24 hours. You may place ice or heat on the areas treated that have become sore, however, do not use heat on inflamed or bruised areas. Heat often brings more relief post needling. You can continue your regular activities, but vigorous activity is not recommended initially after the treatment for 24 hours. DN is best combined with other physical therapy such as strengthening, stretching, and other therapies.

## 2021-02-04 ENCOUNTER — Ambulatory Visit (INDEPENDENT_AMBULATORY_CARE_PROVIDER_SITE_OTHER): Payer: Managed Care, Other (non HMO) | Admitting: Rehabilitative and Restorative Service Providers"

## 2021-02-04 ENCOUNTER — Encounter: Payer: Self-pay | Admitting: Rehabilitative and Restorative Service Providers"

## 2021-02-04 ENCOUNTER — Other Ambulatory Visit: Payer: Self-pay

## 2021-02-04 DIAGNOSIS — M6281 Muscle weakness (generalized): Secondary | ICD-10-CM

## 2021-02-04 DIAGNOSIS — M5030 Other cervical disc degeneration, unspecified cervical region: Secondary | ICD-10-CM | POA: Diagnosis not present

## 2021-02-04 DIAGNOSIS — R293 Abnormal posture: Secondary | ICD-10-CM | POA: Diagnosis not present

## 2021-02-04 DIAGNOSIS — R29898 Other symptoms and signs involving the musculoskeletal system: Secondary | ICD-10-CM | POA: Diagnosis not present

## 2021-02-04 NOTE — Therapy (Signed)
[facility] [place] [address] [place], [place], [address] Phone: [phone]   Fax:  [phone]  Physical Therapy Treatment  Patient Details  Name: Rhonda Wilkins MRN: [id number] Date of Birth: [birth year] Referring Provider (PT): Dr Rhonda Wilkins   Encounter Date: 02/04/2021   PT End of Session - 02/04/21 0804     Visit Number 4    Number of Visits 12    Date for PT Re-Evaluation 03/08/21    PT Start Time 0803    PT Stop Time 0852    PT Time Calculation (min) 49 min    Activity Tolerance Patient tolerated treatment well             Past Medical History:  Diagnosis Date   No pertinent past medical history     Past Surgical History:  Procedure Laterality Date   KNEE ARTHROSCOPY Left 1982    There were no vitals filed for this visit.   Subjective Assessment - 02/04/21 0804     Subjective No radicular symptoms in UE's in the past two days. Exercises have not been as consistent in the past two days. Working on posture and alignment as often as she thinks of it.    Currently in Pain? No/denies    Pain Score 0-No pain                               [facility] Adult PT Treatment/Exercise - 02/04/21 0001       Self-Care   Other Self-Care Comments  postural education and education; transfers and transitional movements      Therapeutic Activites    Other Therapeutic Activities myofacial ball release golf balls at occipital area; release lying supine on swim noodle !-2 min each      Shoulder Exercises: Standing   Extension Strengthening;Both;10 reps;Theraband    Theraband Level (Shoulder Extension) Level 3 (Green)    Row Strengthening;Both;10 reps;Theraband    Theraband Level (Shoulder Row) Level 3 (Green)    Retraction Strengthening;Both;10 reps;Theraband    Theraband Level (Shoulder Retraction) Level 1 (Yellow)    Other Standing Exercises chin tuck 5 sec x 5 reps; scap squeeze 5 sec x 10; L's  x 10; W's x 10 with noodle along spine      Shoulder Exercises: Stretch   Other Shoulder Stretches doorway 3 positions 20 sec hold x 2 reps each position    Other Shoulder Stretches nerve stretch supine; prolonged snow angel ~ 2 min arms at ~ 90 deg      Moist Heat Therapy   Number Minutes Moist Heat 10 Minutes    Moist Heat Location Cervical;Shoulder      Electrical Stimulation   Electrical Stimulation Location bilat cervical; upper traps    Electrical Stimulation Action TENS    Electrical Stimulation Parameters to tolerance    Electrical Stimulation Goals Tone      Manual Therapy   Joint Mobilization cervical and upper thoracic PA mobs Grade II/III    Soft tissue mobilization deep tissue work to tolerance through ant/lat/posterior cervical and upper trap musculature; pecs; arms bilat    Myofascial Release anterior chest    Passive ROM lateral cervical flexion; cervical flexion 10-15 sec hold x 2 each position    Manual Traction cervical traction 10-15 sec throughout treatment 3-4 reps 10-15 sec hold  PT Long Term Goals - 01/25/21 1456       PT LONG TERM GOAL #1   Title Improve posture and alignment with patient to demonstrate improved upright posture with posterior shoulder girdle engaged (middle trap and lower trap strength 4+/5 to 5/5)    Time 6    Status New    Target Date 03/08/21      PT LONG TERM GOAL #2   Title Decrease frequency, intensity and duration of radicular symptoms to allow patient to use UE's for all functional activities    Time 6    Period Weeks    Status New    Target Date 03/08/21      PT LONG TERM GOAL #3   Title Increase cervical ROM and mobilty to WFL's in all planes without pain/tightness    Time 6    Period Weeks    Target Date 03/08/21      PT LONG TERM GOAL #4   Title Independent in HEP with patient to demonstrate and/or verbalize proper posture for rest and functional activities    Time 6     Period Weeks    Status New    Target Date 03/08/21      PT LONG TERM GOAL #5   Title Improve functional assessment score to 70    Time 6    Period Weeks    Status New    Target Date 03/08/21                   Plan - 02/04/21 0806     Clinical Impression Statement Continued positive response to treatment with good resolution of radicular symptoms in the past two days. Continued tightness through the bilat cervical musculature greatest at mid cervical spine and SCM/clavicular area. Progressing well toward stated goals of therapy.    Rehab Potential Good    PT Frequency 2x / week    PT Duration 6 weeks    PT Treatment/Interventions ADLs/Self Care Home Management;Aquatic Therapy;Cryotherapy;Electrical Stimulation;Iontophoresis 4mg /ml Dexamethasone;Moist Heat;Ultrasound;Therapeutic activities;Therapeutic exercise;Balance training;Neuromuscular re-education;Patient/family education;Manual techniques;Passive range of motion;Dry needling    PT Next Visit Plan review and progress HEP; neural stretch; continue manual work; dry needling as indicated; TENS/modalities as indicated    PT Home Exercise Plan [email or web address]    Consulted and Agree with Plan of Care Patient             Patient will benefit from skilled therapeutic intervention in order to improve the following deficits and impairments:     Visit Diagnosis: DDD (degenerative disc disease), cervical  Other symptoms and signs involving the musculoskeletal system  Abnormal posture  Muscle weakness (generalized)     Problem List Patient Active Problem List   Diagnosis Date Noted   DDD (degenerative disc disease), cervical 01/11/2021   Strain of abdominal muscle 02/07/2020   Nondisplaced fracture of the left third metatarsal base, comminuted fracture, nondisplaced of the left distal fibular shaft 02/04/2020    Rhonda Wilkins 14/08/2019, PT, MPH  02/04/2021, 8:54 AM  [facility] [address]  [place] [address] [place], [place], [place] Phone: [phone]   Fax:  [phone]  Name: Rhonda Wilkins MRN: [name] Date of Birth: [birth year]

## 2021-02-09 ENCOUNTER — Encounter: Payer: Self-pay | Admitting: Rehabilitative and Restorative Service Providers"

## 2021-02-09 ENCOUNTER — Telehealth: Payer: Self-pay

## 2021-02-09 ENCOUNTER — Other Ambulatory Visit: Payer: Self-pay

## 2021-02-09 ENCOUNTER — Ambulatory Visit (INDEPENDENT_AMBULATORY_CARE_PROVIDER_SITE_OTHER): Payer: Managed Care, Other (non HMO) | Admitting: Rehabilitative and Restorative Service Providers"

## 2021-02-09 DIAGNOSIS — R29898 Other symptoms and signs involving the musculoskeletal system: Secondary | ICD-10-CM | POA: Diagnosis not present

## 2021-02-09 DIAGNOSIS — R293 Abnormal posture: Secondary | ICD-10-CM | POA: Diagnosis not present

## 2021-02-09 DIAGNOSIS — M5030 Other cervical disc degeneration, unspecified cervical region: Secondary | ICD-10-CM

## 2021-02-09 DIAGNOSIS — M6281 Muscle weakness (generalized): Secondary | ICD-10-CM | POA: Diagnosis not present

## 2021-02-09 NOTE — Telephone Encounter (Signed)
Patient has an establish care visit coming up in January with Dr Ashley Royalty but currently sees Dr T for Ortho issues, she has an appt coming up on 02/25/21 with Dr T. She stated she was in the hospital and wanted to speak with one of Dr Ts nurses about what she was seen for. I asked patient what it was about, and she did not want to discuss with me what that was about. Please Advise. AM

## 2021-02-09 NOTE — Telephone Encounter (Signed)
Patient left vm for return call. I called her back but she was not able to answer. Will try again

## 2021-02-09 NOTE — Therapy (Signed)
[facility] [place] [address] [place] [address] [place], [place], [address] Phone: [phone]   Fax:  [phone]  Physical Therapy Treatment  Patient Details  Name: Rhonda Wilkins MRN: [id number] Date of Birth: 1960-07-22 Referring Provider (PT): Dr [name]   Encounter Date: 02/09/2021   PT End of Session - 02/09/21 0840     Visit Number 5    Number of Visits 12    Date for PT Re-Evaluation 03/08/21    PT Start Time 0840    PT Stop Time 0928    PT Time Calculation (min) 48 min    Activity Tolerance Patient tolerated treatment well             Past Medical History:  Diagnosis Date   No pertinent past medical history     Past Surgical History:  Procedure Laterality Date   KNEE ARTHROSCOPY Left 1982    There were no vitals filed for this visit.   Subjective Assessment - 02/09/21 0841     Subjective Patient reports that she was bitten by a stray dog Friday and has an area of swelling and an area of tightness in the Lt jaw area. She is on an antibiotic for the bite. She has not had recurrurance of the compression of her arms but has had some intermittent numbness in hands. She can resolve the numbness with moving her hands and arms.    Currently in Pain? No/denies    Pain Score 0-No pain                [facility] PT Assessment - 02/09/21 0001       Assessment   Medical Diagnosis Cervical DDD; UE radicular symptoms    Referring Provider (PT) Dr [name]    Onset Date/Surgical Date 06/28/20    Hand Dominance Right    Next MD Visit 02/23/21    Prior Therapy none      Palpation   Spinal mobility hypomobile mid to upper thoracic and cervical spine with PA and lateral mobs                           [facility] Adult PT Treatment/Exercise - 02/09/21 0001       Neuro Re-ed    Neuro Re-ed Details  working on posture and alignment - scapular control      Shoulder Exercises: Standing   Extension  Strengthening;Both;10 reps;Theraband    Theraband Level (Shoulder Extension) Level 3 (Green)    Row Strengthening;Both;10 reps;Theraband    Theraband Level (Shoulder Row) Level 3 (Green)    Row Limitations boa and arrow x 15each side green TB    Retraction Strengthening;Both;10 reps;Theraband    Theraband Level (Shoulder Retraction) Level 1 (Yellow)    Other Standing Exercises chin tuck 5 sec x 5 reps; scap squeeze 5 sec x 10; L's x 10; W's x 10 with noodle along spine      Shoulder Exercises: Stretch   Other Shoulder Stretches doorway 3 positions 20 sec hold x 2 reps each position    Other Shoulder Stretches nerve stretch supine; prolonged snow angel ~ 2 min arms at ~ 90 deg      Moist Heat Therapy   Number Minutes Moist Heat 10 Minutes    Moist Heat Location Cervical;Shoulder      Manual Therapy   Joint Mobilization thoracic mobs PA and lateral Grade II/III    Soft tissue mobilization STM through the thoracic paraspinals -  thoracic extension using coregeous ball PT assist pulling pt into thoracic extension through shds                     PT Education - 02/09/21 0904     Education Details HEP    Person(s) Educated Patient    Methods Explanation;Demonstration;Tactile cues;Verbal cues;Handout    Comprehension Verbalized understanding;Returned demonstration;Verbal cues required;Tactile cues required                 PT Long Term Goals - 01/25/21 1456       PT LONG TERM GOAL #1   Title Improve posture and alignment with patient to demonstrate improved upright posture with posterior shoulder girdle engaged (middle trap and lower trap strength 4+/5 to 5/5)    Time 6    Status New    Target Date 03/08/21      PT LONG TERM GOAL #2   Title Decrease frequency, intensity and duration of radicular symptoms to allow patient to use UE's for all functional activities    Time 6    Period Weeks    Status New    Target Date 03/08/21      PT LONG TERM GOAL #3   Title  Increase cervical ROM and mobilty to WFL's in all planes without pain/tightness    Time 6    Period Weeks    Target Date 03/08/21      PT LONG TERM GOAL #4   Title Independent in HEP with patient to demonstrate and/or verbalize proper posture for rest and functional activities    Time 6    Period Weeks    Status New    Target Date 03/08/21      PT LONG TERM GOAL #5   Title Improve functional assessment score to 70    Time 6    Period Weeks    Status New    Target Date 03/08/21                   Plan - 02/09/21 0849     Clinical Impression Statement Patient returns today with report of dog bite to her Lt jaw. We will hold manual work to the cervical area until the symptoms in the jaw have resolved. Continue with stretching and strengthening exercises. Trial of manual work through the thoracic area - mobs in sitting. Continued good resolution of radicular symptoms    Rehab Potential Good    PT Frequency 2x / week    PT Duration 6 weeks    PT Treatment/Interventions ADLs/Self Care Home Management;Aquatic Therapy;Cryotherapy;Electrical Stimulation;Iontophoresis 4mg /ml Dexamethasone;Moist Heat;Ultrasound;Therapeutic activities;Therapeutic exercise;Balance training;Neuromuscular re-education;Patient/family education;Manual techniques;Passive range of motion;Dry needling    PT Next Visit Plan review and progress HEP; neural stretch; continue manual work; dry needling as indicated; TENS/modalities as indicated - hold all work through the cervical spine until infection from dog bite resolved    PT Home Exercise Plan [email or web address]    Consulted and Agree with Plan of Care Patient             Patient will benefit from skilled therapeutic intervention in order to improve the following deficits and impairments:     Visit Diagnosis: DDD (degenerative disc disease), cervical  Other symptoms and signs involving the musculoskeletal system  Abnormal posture  Muscle weakness  (generalized)     Problem List Patient Active Problem List   Diagnosis Date Noted   DDD (degenerative disc disease), cervical 01/11/2021   Strain of abdominal muscle  02/07/2020   Nondisplaced fracture of the left third metatarsal base, comminuted fracture, nondisplaced of the left distal fibular shaft 02/04/2020    Emmogene Simson Rober Minion, PT, MPH  02/09/2021, 9:25 AM  [facility] [address] [place] [address] [place], [place], [address] Phone: [phone]   Fax:  [phone]  Name: Geraldyn Shain MRN: [id number] Date of Birth: 1960-10-01

## 2021-02-09 NOTE — Telephone Encounter (Signed)
Called patient, no answer. Left her a voicemail to give [place] a call back to discuss concerns

## 2021-02-09 NOTE — Patient Instructions (Signed)
Access Code: [email or web address] URL: [email or web address] Date: 02/09/2021 Prepared by: Corlis Leak  Exercises Seated Cervical Retraction - 3 x daily - 7 x weekly - 1 sets - 10 reps Standing Scapular Retraction - 3 x daily - 7 x weekly - 1 sets - 10 reps - 10 hold Shoulder External Rotation and Scapular Retraction - 3 x daily - 7 x weekly - 1 sets - 10 reps - 3-5 sec hold Shoulder External Rotation in 45 Degrees Abduction - 2 x daily - 7 x weekly - 1-2 sets - 10 reps - 3 sec hold Doorway Pec Stretch at 60 Degrees Abduction - 3 x daily - 7 x weekly - 1 sets - 3 reps Doorway Pec Stretch at 90 Degrees Abduction - 3 x daily - 7 x weekly - 1 sets - 3 reps - 30 seconds hold Doorway Pec Stretch at 120 Degrees Abduction - 3 x daily - 7 x weekly - 1 sets - 3 reps - 30 second hold hold Shoulder External Rotation and Scapular Retraction with Resistance - 2 x daily - 7 x weekly - 3 sets - 10 reps - 3 hold Scapular Retraction with Resistance - 2 x daily - 7 x weekly - 3 sets - 10 reps - 3 hold Scapular Retraction with Resistance Advanced - 2 x daily - 7 x weekly - 3 sets - 10 reps - 3 hold Shoulder External Rotation with Resistance - 2 x daily - 7 x weekly - 1 sets - 10 reps - 3 sec hold Drawing Bow - 1 x daily - 7 x weekly - 1 sets - 10 reps - 3 sec hold

## 2021-02-10 NOTE — Telephone Encounter (Signed)
Patient came into the office yesterday. I spoke with her at that time. She is scheduled to see Ladona Ridgel on Thursday this week

## 2021-02-11 ENCOUNTER — Ambulatory Visit (INDEPENDENT_AMBULATORY_CARE_PROVIDER_SITE_OTHER): Payer: Managed Care, Other (non HMO) | Admitting: Family Medicine

## 2021-02-11 ENCOUNTER — Ambulatory Visit (INDEPENDENT_AMBULATORY_CARE_PROVIDER_SITE_OTHER): Payer: Managed Care, Other (non HMO) | Admitting: Rehabilitative and Restorative Service Providers"

## 2021-02-11 ENCOUNTER — Encounter: Payer: Self-pay | Admitting: Rehabilitative and Restorative Service Providers"

## 2021-02-11 ENCOUNTER — Other Ambulatory Visit: Payer: Self-pay

## 2021-02-11 ENCOUNTER — Encounter: Payer: Self-pay | Admitting: Family Medicine

## 2021-02-11 VITALS — BP 146/93 | HR 75 | Temp 98.0°F | Ht 62.0 in | Wt 161.0 lb

## 2021-02-11 DIAGNOSIS — R220 Localized swelling, mass and lump, head: Secondary | ICD-10-CM

## 2021-02-11 DIAGNOSIS — W540XXA Bitten by dog, initial encounter: Secondary | ICD-10-CM | POA: Diagnosis not present

## 2021-02-11 DIAGNOSIS — M5030 Other cervical disc degeneration, unspecified cervical region: Secondary | ICD-10-CM | POA: Diagnosis not present

## 2021-02-11 DIAGNOSIS — R293 Abnormal posture: Secondary | ICD-10-CM | POA: Diagnosis not present

## 2021-02-11 DIAGNOSIS — M6281 Muscle weakness (generalized): Secondary | ICD-10-CM | POA: Diagnosis not present

## 2021-02-11 DIAGNOSIS — R29898 Other symptoms and signs involving the musculoskeletal system: Secondary | ICD-10-CM

## 2021-02-11 MED ORDER — AMOXICILLIN-POT CLAVULANATE 875-125 MG PO TABS: 1.0000 | ORAL_TABLET | Freq: Two times a day (BID) | ORAL | 0 refills | Status: AC

## 2021-02-11 NOTE — Patient Instructions (Signed)
Adding another week of antibiotics Continue warm compresses

## 2021-02-11 NOTE — Progress Notes (Signed)
Acute Office Visit  Subjective:    Patient ID: Rhonda Wilkins, female    DOB: 1960-04-03, 60 y.o.   MRN: [id number]  Chief Complaint  Patient presents with   Animal Bite    HPI Patient is in today for hospital follow-up for dog bite.   02/06/21: ED visit for stray dog bite to left jaw. Xray L mandible was negative. She was started on Augmentin (x7 days) and rabies series.    She reports that 3 days ago the wound/abscess was golf ball size and very uncomfortable (was taking ibuprofen). When she went to get her rabies injection on Monday, the nurse looked at it and told her to follow-up with a PCP if the swelling does not get better. Tuesday afternoon (2 days ago) she started to apply warm compresses and doing some holistic (essential oil) treatments. Today the pain is improved. Area is slightly smaller and redness has improved. States area is at least 50% better now. She can still feel a firm area at least 1 inch in diameter. States that there is a small opening that will occasionally drain some "milky" discharge. She denies any fevers, spreading erythema, edema, warmth.       Past Medical History:  Diagnosis Date   No pertinent past medical history     Past Surgical History:  Procedure Laterality Date   KNEE ARTHROSCOPY Left 1982    Family History  Problem Relation Age of Onset   Cancer Mother    Healthy Father     Social History   Socioeconomic History   Marital status: Married    Spouse name: Not on file   Number of children: Not on file   Years of education: Not on file   Highest education level: Not on file  Occupational History   Not on file  Tobacco Use   Smoking status: Never   Smokeless tobacco: Never  Vaping Use   Vaping Use: Never used  Substance and Sexual Activity   Alcohol use: Not Currently   Drug use: Not on file   Sexual activity: Not on file  Other Topics Concern   Not on file  Social History Narrative   Not on file   Social  Determinants of Health   Financial Resource Strain: Not on file  Food Insecurity: Not on file  Transportation Needs: Not on file  Physical Activity: Not on file  Stress: Not on file  Social Connections: Not on file  Intimate Partner Violence: Not on file    Outpatient Medications Prior to Visit  Medication Sig Dispense Refill   predniSONE (DELTASONE) 20 MG tablet Take 2 tablets (40 mg total) by mouth daily with breakfast. 10 tablet 0   predniSONE (DELTASONE) 50 MG tablet One tab PO daily for 5 days. 5 tablet 0   No facility-administered medications prior to visit.    Allergies  Allergen Reactions   Latex Itching and Rash    Review of Systems All review of systems negative except what is listed in the HPI     Objective:    Physical Exam Vitals reviewed.  Constitutional:      Appearance: Normal appearance.  HENT:     Head: Normocephalic.     Comments: See picture - left jaw with ~2 cm x 4 cm induration with mild erythema, no warmth, drainage, or surrounding erythema Skin:    General: Skin is warm and dry.  Neurological:     General: No focal deficit present.  Mental Status: She is alert and oriented to person, place, and time.  Psychiatric:        Mood and Affect: Mood normal.        Behavior: Behavior normal.        Judgment: Judgment normal.          BP (!) 146/93 (BP Location: Left Arm, Patient Position: Sitting, Cuff Size: Normal)    Pulse 75    Temp 98 F (36.7 C) (Oral)    Ht _0  (1.575 m)    Wt 161 lb 0.6 oz (73 kg)    SpO2 99%    BMI 29.45 kg/m  Wt Readings from Last 3 Encounters:  02/11/21 161 lb 0.6 oz (73 kg)    Health Maintenance Due  Topic Date Due   HIV Screening  Never done   Hepatitis C Screening  Never done   PAP SMEAR-Modifier  Never done   COLONOSCOPY (Pts 45-47yr Insurance coverage will need to be confirmed)  Never done   MAMMOGRAM  Never done   Zoster Vaccines- Shingrix (1 of 2) Never done   INFLUENZA VACCINE  Never done     There are no preventive care reminders to display for this patient.   No results found for: TSH No results found for: WBC, HGB, HCT, MCV, PLT No results found for: NA, K, CHLORIDE, CO2, GLUCOSE, BUN, CREATININE, BILITOT, ALKPHOS, AST, ALT, PROT, ALBUMIN, CALCIUM, ANIONGAP, EGFR, GFR No results found for: CHOL No results found for: HDL No results found for: LDLCALC No results found for: TRIG No results found for: CHOLHDL No results found for: HGBA1C     Assessment & Plan:    1. Dog bite, initial encounter Area is about 50% better per patient, but give the size of indurated area, would prefer to have complete the full 14 days of antibiotics (sending in the additional week). Continue warm compresses and OTC analgesics as needed. Patient aware of signs/symptoms requiring further/urgent evaluation.   - amoxicillin-clavulanate (AUGMENTIN) 875-125 MG tablet; Take 1 tablet by mouth 2 (two) times daily for 7 days.  Dispense: 14 tablet; Refill: 0   Follow-up in the next 7-8 days for recheck.   TPurcell NailsBOlevia Bowens DNP, FNP-C

## 2021-02-11 NOTE — Patient Instructions (Signed)
Access Code: [email or web address] URL: [email or web address] Date: 02/11/2021 Prepared by: Corlis Leak  Exercises Seated Cervical Retraction - 3 x daily - 7 x weekly - 1 sets - 10 reps Standing Scapular Retraction - 3 x daily - 7 x weekly - 1 sets - 10 reps - 10 hold Shoulder External Rotation and Scapular Retraction - 3 x daily - 7 x weekly - 1 sets - 10 reps - 3-5 sec hold Shoulder External Rotation in 45 Degrees Abduction - 2 x daily - 7 x weekly - 1-2 sets - 10 reps - 3 sec hold Doorway Pec Stretch at 60 Degrees Abduction - 3 x daily - 7 x weekly - 1 sets - 3 reps Doorway Pec Stretch at 90 Degrees Abduction - 3 x daily - 7 x weekly - 1 sets - 3 reps - 30 seconds hold Doorway Pec Stretch at 120 Degrees Abduction - 3 x daily - 7 x weekly - 1 sets - 3 reps - 30 second hold hold Shoulder External Rotation and Scapular Retraction with Resistance - 2 x daily - 7 x weekly - 3 sets - 10 reps - 3 hold Scapular Retraction with Resistance - 2 x daily - 7 x weekly - 3 sets - 10 reps - 3 hold Scapular Retraction with Resistance Advanced - 2 x daily - 7 x weekly - 3 sets - 10 reps - 3 hold Shoulder External Rotation with Resistance - 2 x daily - 7 x weekly - 1 sets - 10 reps - 3 sec hold Drawing Bow - 1 x daily - 7 x weekly - 1 sets - 10 reps - 3 sec hold Shoulder External Rotation with Anchored Resistance with Towel Under Elbow - 1 x daily - 7 x weekly - 1-2 sets - 10 reps - 3 sec hold

## 2021-02-11 NOTE — Therapy (Signed)
[facility] [place] [address] [place], [place], [address] Phone: [phone]   Fax:  [phone]  Physical Therapy Treatment  Patient Details  Name: Rhonda Wilkins MRN: [id number] Date of Birth: 07-30-1960 Referring Provider (PT): Dr [name]   Encounter Date: 02/11/2021   PT End of Session - 02/11/21 0850     Visit Number 6    Number of Visits 12    Date for PT Re-Evaluation 03/08/21    PT Start Time 0845    PT Stop Time 0933    PT Time Calculation (min) 48 min    Activity Tolerance Patient tolerated treatment well             Past Medical History:  Diagnosis Date   No pertinent past medical history     Past Surgical History:  Procedure Laterality Date   KNEE ARTHROSCOPY Left 1982    There were no vitals filed for this visit.   Subjective Assessment - 02/11/21 0851     Subjective Patient reports that she still has pain and swelling in the dog bite on her jaw. She sees MD today to recheck wound. Pain is decreased in this area and she is now having some drainage. She has some intermittent numbing in her UE's but no compression. Working on [employer].    Currently in Pain? No/denies    Pain Score 0-No pain    Pain Location Neck                               [facility] Adult PT Treatment/Exercise - 02/11/21 0001       Neuro Re-ed    Neuro Re-ed Details  working on posture and alignment - scapular control      Shoulder Exercises: Standing   External Rotation Strengthening;Right;Left;20 reps;Theraband    Theraband Level (Shoulder External Rotation) Level 3 (Green)    Extension Strengthening;Both;20 reps;Theraband    Theraband Level (Shoulder Extension) Level 3 (Green)    Row Strengthening;Both;20 reps;Theraband    Theraband Level (Shoulder Row) Level 3 (Green)    Row Limitations bow and arrow x 20 each side green TB    Retraction Strengthening;Both;10 reps;Theraband    Theraband Level  (Shoulder Retraction) Level 1 (Yellow)    Other Standing Exercises chin tuck 5 sec x 5 reps; scap squeeze 5 sec x 10; L's x 10; W's x 10 with noodle along spine      Shoulder Exercises: Stretch   Other Shoulder Stretches doorway 3 positions 20 sec hold x 2 reps each position    Other Shoulder Stretches nerve stretch supine; prolonged snow angel ~ 2 min arms at ~ 90 deg      Electrical Stimulation   Electrical Stimulation Location bilat mid thoracic; upper traps    Electrical Stimulation Action TENS    Electrical Stimulation Parameters to tolerance    Electrical Stimulation Goals Tone      Manual Therapy   Joint Mobilization thoracic mobs PA and lateral Grade II/III    Soft tissue mobilization STM through the thoracic paraspinals - upper trap                     PT Education - 02/11/21 0914     Education Details HEP    Person(s) Educated Patient    Methods Explanation;Demonstration;Tactile cues;Verbal cues;Handout    Comprehension Verbalized understanding;Returned demonstration;Verbal cues required;Tactile cues required  PT Long Term Goals - 01/25/21 1456       PT LONG TERM GOAL #1   Title Improve posture and alignment with patient to demonstrate improved upright posture with posterior shoulder girdle engaged (middle trap and lower trap strength 4+/5 to 5/5)    Time 6    Status New    Target Date 03/08/21      PT LONG TERM GOAL #2   Title Decrease frequency, intensity and duration of radicular symptoms to allow patient to use UE's for all functional activities    Time 6    Period Weeks    Status New    Target Date 03/08/21      PT LONG TERM GOAL #3   Title Increase cervical ROM and mobilty to WFL's in all planes without pain/tightness    Time 6    Period Weeks    Target Date 03/08/21      PT LONG TERM GOAL #4   Title Independent in HEP with patient to demonstrate and/or verbalize proper posture for rest and functional activities     Time 6    Period Weeks    Status New    Target Date 03/08/21      PT LONG TERM GOAL #5   Title Improve functional assessment score to 70    Time 6    Period Weeks    Status New    Target Date 03/08/21                   Plan - 02/11/21 0854     Clinical Impression Statement Patient continues to have resolving UE symptoms. Patient is working on her exercises. Still avoiding work through the cervical spine until symptoms from dog bite have resolved.    Rehab Potential Good    PT Frequency 2x / week    PT Duration 6 weeks    PT Treatment/Interventions ADLs/Self Care Home Management;Aquatic Therapy;Cryotherapy;Electrical Stimulation;Iontophoresis 4mg /ml Dexamethasone;Moist Heat;Ultrasound;Therapeutic activities;Therapeutic exercise;Balance training;Neuromuscular re-education;Patient/family education;Manual techniques;Passive range of motion;Dry needling    PT Next Visit Plan review and progress HEP; neural stretch; continue manual work; dry needling as indicated; TENS/modalities as indicated - hold all work through the cervical spine until infection from dog bite resolved    PT Home Exercise Plan [email or web address]    Consulted and Agree with Plan of Care Patient             Patient will benefit from skilled therapeutic intervention in order to improve the following deficits and impairments:     Visit Diagnosis: DDD (degenerative disc disease), cervical  Other symptoms and signs involving the musculoskeletal system  Abnormal posture  Muscle weakness (generalized)     Problem List Patient Active Problem List   Diagnosis Date Noted   DDD (degenerative disc disease), cervical 01/11/2021   Strain of abdominal muscle 02/07/2020   Nondisplaced fracture of the left third metatarsal base, comminuted fracture, nondisplaced of the left distal fibular shaft 02/04/2020    Sangita Zani 14/08/2019, PT, MPH 02/11/2021, 9:28 AM  [facility] [address] [place], [place], [place] Phone: [phone]   Fax:  [phone]  Name: Rhonda Wilkins MRN: [name] Date of Birth: 1960/11/05

## 2021-02-16 ENCOUNTER — Ambulatory Visit (INDEPENDENT_AMBULATORY_CARE_PROVIDER_SITE_OTHER): Payer: Managed Care, Other (non HMO) | Admitting: Rehabilitative and Restorative Service Providers"

## 2021-02-16 ENCOUNTER — Other Ambulatory Visit: Payer: Self-pay

## 2021-02-16 ENCOUNTER — Encounter: Payer: Self-pay | Admitting: Rehabilitative and Restorative Service Providers"

## 2021-02-16 DIAGNOSIS — R29898 Other symptoms and signs involving the musculoskeletal system: Secondary | ICD-10-CM

## 2021-02-16 DIAGNOSIS — R293 Abnormal posture: Secondary | ICD-10-CM

## 2021-02-16 DIAGNOSIS — M6281 Muscle weakness (generalized): Secondary | ICD-10-CM

## 2021-02-16 DIAGNOSIS — M5030 Other cervical disc degeneration, unspecified cervical region: Secondary | ICD-10-CM | POA: Diagnosis not present

## 2021-02-16 NOTE — Patient Instructions (Signed)
Access Code: [email or web address] URL: [email or web address] Date: 02/16/2021 Prepared by: Corlis Leak  Exercises Seated Cervical Retraction - 3 x daily - 7 x weekly - 1 sets - 10 reps Standing Scapular Retraction - 3 x daily - 7 x weekly - 1 sets - 10 reps - 10 hold Shoulder External Rotation and Scapular Retraction - 3 x daily - 7 x weekly - 1 sets - 10 reps - 3-5 sec hold Shoulder External Rotation in 45 Degrees Abduction - 2 x daily - 7 x weekly - 1-2 sets - 10 reps - 3 sec hold Doorway Pec Stretch at 60 Degrees Abduction - 3 x daily - 7 x weekly - 1 sets - 3 reps Doorway Pec Stretch at 90 Degrees Abduction - 3 x daily - 7 x weekly - 1 sets - 3 reps - 30 seconds hold Doorway Pec Stretch at 120 Degrees Abduction - 3 x daily - 7 x weekly - 1 sets - 3 reps - 30 second hold hold Shoulder External Rotation and Scapular Retraction with Resistance - 2 x daily - 7 x weekly - 3 sets - 10 reps - 3 hold Scapular Retraction with Resistance - 2 x daily - 7 x weekly - 3 sets - 10 reps - 3 hold Scapular Retraction with Resistance Advanced - 2 x daily - 7 x weekly - 3 sets - 10 reps - 3 hold Shoulder External Rotation with Resistance - 2 x daily - 7 x weekly - 1 sets - 10 reps - 3 sec hold Drawing Bow - 1 x daily - 7 x weekly - 1 sets - 10 reps - 3 sec hold Shoulder External Rotation with Anchored Resistance with Towel Under Elbow - 1 x daily - 7 x weekly - 1-2 sets - 10 reps - 3 sec hold Standing Shoulder Flexion Stretch on Wall - 2 x daily - 7 x weekly - 2 sets - 10 reps - 3 sec hold

## 2021-02-16 NOTE — Therapy (Signed)
[facility] [place] [address] [place], [place], [address] Phone: [phone]   Fax:  [phone]  Physical Therapy Treatment  Patient Details  Name: Rhonda Wilkins MRN: [id number] Date of Birth: Dec 12, 1960 Referring Provider (PT): Dr [name]   Encounter Date: 02/16/2021   PT End of Session - 02/16/21 0805     Visit Number 7    Number of Visits 12    Date for PT Re-Evaluation 03/08/21    PT Start Time 0803    PT Stop Time 0851    PT Time Calculation (min) 48 min    Activity Tolerance Patient tolerated treatment well             Past Medical History:  Diagnosis Date   No pertinent past medical history     Past Surgical History:  Procedure Laterality Date   KNEE ARTHROSCOPY Left 1982    There were no vitals filed for this visit.   Subjective Assessment - 02/16/21 0806     Subjective Dog bite resolved. She is finishing her antibiotic and no longer having sweeling or any pain in the jaw area. She has the numbing feeling in her fingers about twice on Saturday and Sunday but not yesterday. No compression. She is doing her exercises at home. Has ordered TENs unit for home but it has not arrived.    Currently in Pain? No/denies    Pain Score 0-No pain                [facility] PT Assessment - 02/16/21 0001       Assessment   Medical Diagnosis Cervical DDD; UE radicular symptoms    Referring Provider (PT) Dr [name]    Onset Date/Surgical Date 06/28/20    Hand Dominance Right    Next MD Visit 02/23/21    Prior Therapy none      AROM   Cervical Flexion 60    Cervical Extension 67    Cervical - Right Side Bend 29    Cervical - Left Side Bend 35    Cervical - Right Rotation 61    Cervical - Left Rotation 68      Palpation   Spinal mobility hypomobile mid to upper thoracic and cervical spine with PA and lateral mobs    Palpation comment persistent muscular tightness Rt > Lt ant/lat/posterior  cervical musculature; pecs; upper traps; leveator; teres                           [facility] Adult PT Treatment/Exercise - 02/16/21 0001       Shoulder Exercises: Standing   External Rotation Strengthening;Right;Left;20 reps;Theraband    Theraband Level (Shoulder External Rotation) Level 3 (Green)    Extension Strengthening;Both;20 reps;Theraband    Theraband Level (Shoulder Extension) Level 3 (Green)    Row Strengthening;Both;20 reps;Theraband    Theraband Level (Shoulder Row) Level 3 (Green)    Row Limitations bow and arrow x 20 each side green TB    Retraction Strengthening;Both;10 reps;Theraband    Theraband Level (Shoulder Retraction) Level 1 (Yellow)    Other Standing Exercises chin tuck 5 sec x 5 reps; scap squeeze 5 sec x 10; L's x 10; W's x 10 with noodle along spine      Shoulder Exercises: Stretch   Other Shoulder Stretches doorway 3 positions 20 sec hold x 2 reps each position; overdoor shouldre flexion stretch 30 sec x 2 reps holding dowel  Other Shoulder Stretches nerve stretch supine; prolonged snow angel ~ 2 min arms at ~ 90 deg      Moist Heat Therapy   Number Minutes Moist Heat 10 Minutes    Moist Heat Location Cervical;Shoulder      Electrical Stimulation   Electrical Stimulation Location bilat mid thoracic; upper traps    Electrical Stimulation Action TENs    Electrical Stimulation Parameters to tolerance    Electrical Stimulation Goals Tone      Manual Therapy   Joint Mobilization cervical PA and lateral mobs    Soft tissue mobilization deep tissue work through the pecs and posterior cervical musculature    Myofascial Release anterior chest    Passive ROM lateral cervical flexion; cervical flexion 10-15 sec hold x 2 each position    Manual Traction cervical traction 10-15 sec throughout treatment 3-4 reps 10-15 sec hold                     PT Education - 02/16/21 0818     Education Details HEP    Person(s) Educated Patient     Methods Explanation;Demonstration;Tactile cues;Verbal cues;Handout    Comprehension Verbalized understanding;Returned demonstration;Verbal cues required;Tactile cues required                 PT Long Term Goals - 01/25/21 1456       PT LONG TERM GOAL #1   Title Improve posture and alignment with patient to demonstrate improved upright posture with posterior shoulder girdle engaged (middle trap and lower trap strength 4+/5 to 5/5)    Time 6    Status New    Target Date 03/08/21      PT LONG TERM GOAL #2   Title Decrease frequency, intensity and duration of radicular symptoms to allow patient to use UE's for all functional activities    Time 6    Period Weeks    Status New    Target Date 03/08/21      PT LONG TERM GOAL #3   Title Increase cervical ROM and mobilty to WFL's in all planes without pain/tightness    Time 6    Period Weeks    Target Date 03/08/21      PT LONG TERM GOAL #4   Title Independent in HEP with patient to demonstrate and/or verbalize proper posture for rest and functional activities    Time 6    Period Weeks    Status New    Target Date 03/08/21      PT LONG TERM GOAL #5   Title Improve functional assessment score to 70    Time 6    Period Weeks    Status New    Target Date 03/08/21                   Plan - 02/16/21 0813     Clinical Impression Statement Patient's dog bite is improved/resolved. She is finishing up with her antibiotics. Symptoms have improved but pt continues to have intermittent numbness in bilat UE's. Symptoms are less frequent. Patient demonstrates good gains in cervical ROM. She has persistent muscular tightness through the cervical area. Progressing well toward stated goals of therapy.    Rehab Potential Good    PT Frequency 2x / week    PT Duration 6 weeks    PT Treatment/Interventions ADLs/Self Care Home Management;Aquatic Therapy;Cryotherapy;Electrical Stimulation;Iontophoresis 4mg /ml Dexamethasone;Moist  Heat;Ultrasound;Therapeutic activities;Therapeutic exercise;Balance training;Neuromuscular re-education;Patient/family education;Manual techniques;Passive range of motion;Dry needling    PT  Next Visit Plan review and progress HEP; neural stretch; continue manual work; dry needling as indicated; TENS/modalities as indicated    PT Home Exercise Plan [email or web address]    Consulted and Agree with Plan of Care Patient             Patient will benefit from skilled therapeutic intervention in order to improve the following deficits and impairments:     Visit Diagnosis: DDD (degenerative disc disease), cervical  Other symptoms and signs involving the musculoskeletal system  Abnormal posture  Muscle weakness (generalized)     Problem List Patient Active Problem List   Diagnosis Date Noted   DDD (degenerative disc disease), cervical 01/11/2021   Strain of abdominal muscle 02/07/2020   Nondisplaced fracture of the left third metatarsal base, comminuted fracture, nondisplaced of the left distal fibular shaft 02/04/2020    [place] Rober Minion, PT, MPH  02/16/2021, 8:41 AM  [facility] [address] [place], [place], [address] Phone: [phone]   Fax:  [phone]  Name: Rhonda Wilkins MRN: [id number] Date of Birth: November 11, 1960

## 2021-02-18 ENCOUNTER — Encounter: Payer: Self-pay | Admitting: Rehabilitative and Restorative Service Providers"

## 2021-02-18 ENCOUNTER — Other Ambulatory Visit: Payer: Self-pay

## 2021-02-18 ENCOUNTER — Ambulatory Visit (INDEPENDENT_AMBULATORY_CARE_PROVIDER_SITE_OTHER): Payer: Managed Care, Other (non HMO) | Admitting: Rehabilitative and Restorative Service Providers"

## 2021-02-18 DIAGNOSIS — M6281 Muscle weakness (generalized): Secondary | ICD-10-CM | POA: Diagnosis not present

## 2021-02-18 DIAGNOSIS — R29898 Other symptoms and signs involving the musculoskeletal system: Secondary | ICD-10-CM

## 2021-02-18 DIAGNOSIS — R293 Abnormal posture: Secondary | ICD-10-CM

## 2021-02-18 DIAGNOSIS — M5030 Other cervical disc degeneration, unspecified cervical region: Secondary | ICD-10-CM | POA: Diagnosis not present

## 2021-02-18 NOTE — Therapy (Signed)
[facility] [place] [address] [place], [place], [address] Phone: [phone]   Fax:  [phone]  Physical Therapy Treatment  Patient Details  Name: Rhonda Wilkins MRN: [id number] Date of Birth: 10-16-1960 Referring Provider (PT): Dr Rhonda Wilkins   Encounter Date: 02/18/2021   PT End of Session - 02/18/21 0807     Visit Number 8    Number of Visits 12    Date for PT Re-Evaluation 03/08/21    PT Start Time 0805    PT Stop Time 0853    PT Time Calculation (min) 48 min    Activity Tolerance Patient tolerated treatment well             Past Medical History:  Diagnosis Date   No pertinent past medical history     Past Surgical History:  Procedure Laterality Date   KNEE ARTHROSCOPY Left 1982    There were no vitals filed for this visit.   Subjective Assessment - 02/18/21 0808     Subjective Patient reports that she is still having some of the "numby" feeling in arms but not in her hands. No compression. Symptoms are much better than they were. Still has not received TENS unit for home.    Currently in Pain? No/denies    Pain Score 0-No pain                               [facility] Adult PT Treatment/Exercise - 02/18/21 0001       Shoulder Exercises: Standing   External Rotation Strengthening;Right;Left;20 reps;Theraband    Theraband Level (Shoulder External Rotation) Level 3 (Green)    Extension Strengthening;Both;20 reps;Theraband    Theraband Level (Shoulder Extension) Level 3 (Green)    Row Strengthening;Both;20 reps;Theraband    Theraband Level (Shoulder Row) Level 3 (Green)    Row Limitations bow and arrow x 20 each side green TB    Retraction Strengthening;Both;10 reps;Theraband    Theraband Level (Shoulder Retraction) Level 1 (Yellow)    Other Standing Exercises chin tuck 5 sec x 5 reps; scap squeeze 5 sec x 10; L's x 10; W's x 10 with noodle along spine      Shoulder Exercises:  Stretch   Other Shoulder Stretches doorway 3 positions 20 sec hold x 2 reps each position; overdoor shouldre flexion stretch 30 sec x 2 reps holding dowel      Manual Therapy   Manual therapy comments skilled palpation to assess response to DN and manual work    Joint Mobilization cervical PA and lateral mobs    Soft tissue mobilization deep tissue work through the pecs and posterior cervical musculature              Trigger Point Dry Needling - 02/18/21 0001     Consent Given? Yes    Education Handout Provided Yes    Muscles Treated Head and Neck Upper trapezius;Levator scapulae;Scalenes;Cervical multifidi;Splenius capitus    Other Dry Needling bilat    Splenius capitus Response Palpable increased muscle length    Cervical multifidi Response Palpable increased muscle length                   PT Education - 02/18/21 0816     Education Details DN    Person(s) Educated Patient    Methods Explanation;Handout    Comprehension Verbalized understanding  PT Long Term Goals - 01/25/21 1456       PT LONG TERM GOAL #1   Title Improve posture and alignment with patient to demonstrate improved upright posture with posterior shoulder girdle engaged (middle trap and lower trap strength 4+/5 to 5/5)    Time 6    Status New    Target Date 03/08/21      PT LONG TERM GOAL #2   Title Decrease frequency, intensity and duration of radicular symptoms to allow patient to use UE's for all functional activities    Time 6    Period Weeks    Status New    Target Date 03/08/21      PT LONG TERM GOAL #3   Title Increase cervical ROM and mobilty to WFL's in all planes without pain/tightness    Time 6    Period Weeks    Target Date 03/08/21      PT LONG TERM GOAL #4   Title Independent in HEP with patient to demonstrate and/or verbalize proper posture for rest and functional activities    Time 6    Period Weeks    Status New    Target Date 03/08/21       PT LONG TERM GOAL #5   Title Improve functional assessment score to 70    Time 6    Period Weeks    Status New    Target Date 03/08/21                   Plan - 02/18/21 0810     Clinical Impression Statement Gradual improvement in UE symptoms but does have some intermittent numbing in her arms. Less in hands. She has been consistent with exercises, doing some every day.She has made corrections in posture and alignment in sitting and even in driving. Trial of DN to posterior cervical musculature - tolerated fairly well for only 4 needles posterior cervical area    Rehab Potential Good    PT Frequency 2x / week    PT Duration 6 weeks    PT Treatment/Interventions ADLs/Self Care Home Management;Aquatic Therapy;Cryotherapy;Electrical Stimulation;Iontophoresis 4mg /ml Dexamethasone;Moist Heat;Ultrasound;Therapeutic activities;Therapeutic exercise;Balance training;Neuromuscular re-education;Patient/family education;Manual techniques;Passive range of motion;Dry needling    PT Next Visit Plan review and progress HEP; neural stretch; continue manual work; assess response to dry needling; TENS/modalities as indicated    PT Home Exercise Plan [email or web address]    Consulted and Agree with Plan of Care Patient             Patient will benefit from skilled therapeutic intervention in order to improve the following deficits and impairments:     Visit Diagnosis: DDD (degenerative disc disease), cervical  Other symptoms and signs involving the musculoskeletal system  Abnormal posture  Muscle weakness (generalized)     Problem List Patient Active Problem List   Diagnosis Date Noted   DDD (degenerative disc disease), cervical 01/11/2021   Strain of abdominal muscle 02/07/2020   Nondisplaced fracture of the left third metatarsal base, comminuted fracture, nondisplaced of the left distal fibular shaft 02/04/2020    Rhonda Wilkins 14/08/2019, PT, MPH  02/18/2021, 8:39 AM  [facility] [address] [place], [place], [place] Phone: [phone]   Fax:  [phone]  Name: Rhonda Wilkins MRN: Rhonda Wilkins Date of Birth: [birth year]

## 2021-02-18 NOTE — Patient Instructions (Signed)

## 2021-02-19 ENCOUNTER — Ambulatory Visit (INDEPENDENT_AMBULATORY_CARE_PROVIDER_SITE_OTHER): Payer: Managed Care, Other (non HMO) | Admitting: Family Medicine

## 2021-02-19 ENCOUNTER — Encounter: Payer: Self-pay | Admitting: Family Medicine

## 2021-02-19 VITALS — BP 129/85 | HR 72 | Temp 97.6°F | Ht 62.0 in | Wt 158.0 lb

## 2021-02-19 DIAGNOSIS — R220 Localized swelling, mass and lump, head: Secondary | ICD-10-CM

## 2021-02-19 DIAGNOSIS — W540XXD Bitten by dog, subsequent encounter: Secondary | ICD-10-CM

## 2021-02-19 NOTE — Progress Notes (Signed)
Acute Office Visit  Subjective:    Patient ID: Rhonda Wilkins, female    DOB: [birth year], 60 y.o.   MRN: [id number]  Chief Complaint  Patient presents with   Wound Check    Wound Check  Patient is in today for wound check.   02/06/21: ED visit for stray dog bite to left jaw. Xray L mandible was negative. She was started on Augmentin (x7 days) and rabies series.   02/11/21 Office follow-up: Area about 50% better per patient, but still very firm. Patient was given an additional 7 days of Augmentin.   Today she reports significant improvement. She does not feel anything anymore. Reports there is still a firm area under the skin, but it is continuing to decrease in size. She still has a few more days of antibiotics to finish. She denies any surrounding edema, warmth, redness, streaking, pain, purulent drainage. She has been excellent results with warm compresses.      Past Medical History:  Diagnosis Date   No pertinent past medical history     Past Surgical History:  Procedure Laterality Date   KNEE ARTHROSCOPY Left 1982    Family History  Problem Relation Age of Onset   Cancer Mother    Healthy Father     Social History   Socioeconomic History   Marital status: Married    Spouse name: Not on file   Number of children: Not on file   Years of education: Not on file   Highest education level: Not on file  Occupational History   Not on file  Tobacco Use   Smoking status: Never   Smokeless tobacco: Never  Vaping Use   Vaping Use: Never used  Substance and Sexual Activity   Alcohol use: Not Currently   Drug use: Not on file   Sexual activity: Not on file  Other Topics Concern   Not on file  Social History Narrative   Not on file   Social Determinants of Health   Financial Resource Strain: Not on file  Food Insecurity: Not on file  Transportation Needs: Not on file  Physical Activity: Not on file  Stress: Not on file  Social Connections: Not on file   Intimate Partner Violence: Not on file    Outpatient Medications Prior to Visit  Medication Sig Dispense Refill   predniSONE (DELTASONE) 20 MG tablet Take 2 tablets (40 mg total) by mouth daily with breakfast. 10 tablet 0   predniSONE (DELTASONE) 50 MG tablet One tab PO daily for 5 days. 5 tablet 0   No facility-administered medications prior to visit.    Allergies  Allergen Reactions   Latex Itching and Rash    Review of Systems All review of systems negative except what is listed in the HPI     Objective:    Physical Exam Vitals reviewed.  Constitutional:      Appearance: Normal appearance.  Skin:    General: Skin is warm and dry.     Findings: No erythema or rash.     Comments: See picture of healing dog bite wound. Area of induration is much smaller, now about 2 cm diameter, no fluctuance, edema, erythema, warmth, streaking   Neurological:     General: No focal deficit present.     Mental Status: She is alert and oriented to person, place, and time.  Psychiatric:        Mood and Affect: Mood normal.        Behavior: Behavior normal.  Thought Content: Thought content normal.        Judgment: Judgment normal.          BP 129/85 (BP Location: Left Arm, Patient Position: Sitting, Cuff Size: Normal)    Pulse 72    Temp 97.6 F (36.4 C) (Oral)    Ht 5' 2"  (1.575 m)    Wt 158 lb (71.7 kg)    BMI 28.90 kg/m  Wt Readings from Last 3 Encounters:  02/19/21 158 lb (71.7 kg)  02/11/21 161 lb 0.6 oz (73 kg)    Health Maintenance Due  Topic Date Due   HIV Screening  Never done   Hepatitis C Screening  Never done   PAP SMEAR-Modifier  Never done   COLONOSCOPY (Pts 45-70yr Insurance coverage will need to be confirmed)  Never done   MAMMOGRAM  Never done    There are no preventive care reminders to display for this patient.   No results found for: TSH No results found for: WBC, HGB, HCT, MCV, PLT No results found for: NA, K, CHLORIDE, CO2, GLUCOSE, BUN,  CREATININE, BILITOT, ALKPHOS, AST, ALT, PROT, ALBUMIN, CALCIUM, ANIONGAP, EGFR, GFR No results found for: CHOL No results found for: HDL No results found for: LDLCALC No results found for: TRIG No results found for: CHOLHDL No results found for: HGBA1C     Assessment & Plan:   1. Dog bite, subsequent encounter Significant improvement, even from last week. Recommend she finish out her last few days of antibiotics and continue the warm compresses. If area stops improving or infectious symptoms return, follow-up.   Follow-up as needed.   TPurcell NailsBOlevia Bowens DNP, FNP-C

## 2021-02-23 ENCOUNTER — Ambulatory Visit: Payer: Managed Care, Other (non HMO) | Admitting: Sports Medicine

## 2021-02-23 ENCOUNTER — Encounter: Payer: Managed Care, Other (non HMO) | Admitting: Physical Therapy

## 2021-02-25 ENCOUNTER — Ambulatory Visit (INDEPENDENT_AMBULATORY_CARE_PROVIDER_SITE_OTHER): Payer: Managed Care, Other (non HMO) | Admitting: Sports Medicine

## 2021-02-25 ENCOUNTER — Other Ambulatory Visit: Payer: Self-pay

## 2021-02-25 DIAGNOSIS — M5030 Other cervical disc degeneration, unspecified cervical region: Secondary | ICD-10-CM

## 2021-02-25 NOTE — Progress Notes (Signed)
° ° °  Procedures performed today:    None.  Independent interpretation of notes and tests performed by another provider:   None.  Brief History, Exam, Impression, and Recommendations:    DDD (degenerative disc disease), cervical Rhonda Wilkins returns, she is doing a lot better after physical therapy and a couple of burst of prednisone, still has a bit of occasional numbness, can live with it, next step should she not be able to live with it would be MRI and epidural, otherwise return as needed.    ___________________________________________ [name]. [name], M.D., ABFM., CAQSM. Primary Care and Sports Medicine [facility]  Adjunct Instructor of Family Medicine  [employer]

## 2021-02-25 NOTE — Assessment & Plan Note (Signed)
Rhonda Wilkins returns, she is doing a lot better after physical therapy and a couple of burst of prednisone, still has a bit of occasional numbness, can live with it, next step should she not be able to live with it would be MRI and epidural, otherwise return as needed.

## 2021-02-26 ENCOUNTER — Encounter: Payer: Managed Care, Other (non HMO) | Admitting: Physical Therapy

## 2021-03-02 ENCOUNTER — Encounter: Payer: Managed Care, Other (non HMO) | Admitting: Rehabilitative and Restorative Service Providers"

## 2021-03-04 ENCOUNTER — Ambulatory Visit
Payer: Managed Care, Other (non HMO) | Attending: Sports Medicine | Admitting: Rehabilitative and Restorative Service Providers"

## 2021-03-04 ENCOUNTER — Other Ambulatory Visit: Payer: Self-pay

## 2021-03-04 ENCOUNTER — Encounter: Payer: Self-pay | Admitting: Rehabilitative and Restorative Service Providers"

## 2021-03-04 ENCOUNTER — Encounter: Payer: Self-pay | Admitting: Family Medicine

## 2021-03-04 ENCOUNTER — Ambulatory Visit (INDEPENDENT_AMBULATORY_CARE_PROVIDER_SITE_OTHER): Payer: Managed Care, Other (non HMO) | Admitting: Family Medicine

## 2021-03-04 VITALS — BP 133/92 | HR 97 | Temp 97.6°F | Ht 62.0 in | Wt 156.0 lb

## 2021-03-04 DIAGNOSIS — Z8781 Personal history of (healed) traumatic fracture: Secondary | ICD-10-CM

## 2021-03-04 DIAGNOSIS — M6281 Muscle weakness (generalized): Secondary | ICD-10-CM | POA: Diagnosis not present

## 2021-03-04 DIAGNOSIS — R29898 Other symptoms and signs involving the musculoskeletal system: Secondary | ICD-10-CM

## 2021-03-04 DIAGNOSIS — Z0000 Encounter for general adult medical examination without abnormal findings: Secondary | ICD-10-CM

## 2021-03-04 DIAGNOSIS — R293 Abnormal posture: Secondary | ICD-10-CM

## 2021-03-04 DIAGNOSIS — Z1231 Encounter for screening mammogram for malignant neoplasm of breast: Secondary | ICD-10-CM | POA: Diagnosis not present

## 2021-03-04 DIAGNOSIS — Z124 Encounter for screening for malignant neoplasm of cervix: Secondary | ICD-10-CM | POA: Diagnosis not present

## 2021-03-04 DIAGNOSIS — Z1211 Encounter for screening for malignant neoplasm of colon: Secondary | ICD-10-CM | POA: Insufficient documentation

## 2021-03-04 DIAGNOSIS — M5030 Other cervical disc degeneration, unspecified cervical region: Secondary | ICD-10-CM | POA: Diagnosis present

## 2021-03-04 DIAGNOSIS — L603 Nail dystrophy: Secondary | ICD-10-CM

## 2021-03-04 DIAGNOSIS — Z13220 Encounter for screening for lipoid disorders: Secondary | ICD-10-CM

## 2021-03-04 NOTE — Assessment & Plan Note (Signed)
Who is currently managed by Dr. [name].  She is currently doing physical therapy.

## 2021-03-04 NOTE — Progress Notes (Signed)
Rhonda Wilkins - 61 y.o. female MRN [id number]  Date of birth: Jun 20, 1960  Subjective Chief Complaint  Patient presents with   Establish Care    HPI Rhonda Wilkins is a 61 year old female here today for initial visit to establish care.  She has not had a primary care provider in several years.  She has history of degenerative disc disease of the spine.  She has seen Dr. [name] for management of her degenerative disc disease and is currently doing physical therapy.  She would like referral to GYN for cervical cancer screening.  She is behind on mammogram and colon cancer screening and would like referrals placed for this.  She would like to have labs ordered in anticipation of upcoming annual exam.  She has noted some changes to her hair and nails and would like to have TSH as well as vitamins updated.  ROS:  A comprehensive ROS was completed and negative except as noted per HPI  Allergies  Allergen Reactions   Latex Itching and Rash    Past Medical History:  Diagnosis Date   No pertinent past medical history     Past Surgical History:  Procedure Laterality Date   KNEE ARTHROSCOPY Left 1982    Social History   Socioeconomic History   Marital status: Married    Spouse name: Not on file   Number of children: Not on file   Years of education: Not on file   Highest education level: Not on file  Occupational History   Not on file  Tobacco Use   Smoking status: Never   Smokeless tobacco: Never  Vaping Use   Vaping Use: Never used  Substance and Sexual Activity   Alcohol use: Not Currently   Drug use: Not on file   Sexual activity: Not on file  Other Topics Concern   Not on file  Social History Narrative   Not on file   Social Determinants of Health   Financial Resource Strain: Not on file  Food Insecurity: Not on file  Transportation Needs: Not on file  Physical Activity: Not on file  Stress: Not on file  Social Connections: Not on file    Family History   Problem Relation Age of Onset   Cancer Mother    Healthy Father     Health Maintenance  Topic Date Due   HIV Screening  Never done   Hepatitis C Screening  Never done   PAP SMEAR-Modifier  Never done   COLONOSCOPY (Pts 45-10yrs Insurance coverage will need to be confirmed)  Never done   MAMMOGRAM  Never done   Zoster Vaccines- Shingrix (1 of 2) 05/20/2021 (Originally 12/16/2012)   INFLUENZA VACCINE  05/28/2021 (Originally 09/28/2020)   TETANUS/TDAP  02/07/2031   Pneumococcal Vaccine 58-35 Years old  Aged Out   HPV VACCINES  Aged Out     ----------------------------------------------------------------------------------------------------------------------------------------------------------------------------------------------------------------- Physical Exam BP (!) 133/92 (BP Location: Left Arm, Patient Position: Sitting, Cuff Size: Normal)    Pulse 97    Temp 97.6 F (36.4 C)    Ht 5\' 2"  (1.575 m)    Wt 156 lb (70.8 kg)    SpO2 96%    BMI 28.53 kg/m   Physical Exam Constitutional:      Appearance: Normal appearance.  Cardiovascular:     Rate and Rhythm: Normal rate and regular rhythm.  Pulmonary:     Effort: Pulmonary effort is normal.     Breath sounds: Normal breath sounds.  Musculoskeletal:     Cervical back: Neck  supple.  Neurological:     Mental Status: She is alert.  Psychiatric:        Mood and Affect: Mood normal.        Behavior: Behavior normal.    ------------------------------------------------------------------------------------------------------------------------------------------------------------------------------------------------------------------- Assessment and Plan  DDD (degenerative disc disease), cervical Who is currently managed by Dr. [name].  She is currently doing physical therapy.  Colon cancer screening Referral placed to gastroenterology for colonoscopy.  Brittle nails Labs per orders.   No orders of the defined types were  placed in this encounter.   No follow-ups on file.    This visit occurred during the SARS-CoV-2 public health emergency.  Safety protocols were in place, including screening questions prior to the visit, additional usage of staff PPE, and extensive cleaning of exam room while observing appropriate contact time as indicated for disinfecting solutions.

## 2021-03-04 NOTE — Patient Instructions (Addendum)
Nice to meet you today! I have entered referrals to GYN and gastroenterology.  I have ordered a mammogram for you.  Please follow up with me for annual physical.

## 2021-03-04 NOTE — Assessment & Plan Note (Signed)
Referral placed to gastroenterology for colonoscopy.

## 2021-03-04 NOTE — Assessment & Plan Note (Signed)
Labs per orders

## 2021-03-04 NOTE — Therapy (Signed)
[facility] [address] [place], [place], [address] Phone: [phone]   Fax:  [phone]  Physical Therapy Treatment  Patient Details  Name: Rhonda MontaneLynn Wilkins MRN: [id number] Date of Birth: 11-04-1960 Referring Provider (PT): Dr Benjamin Stainhekkekandam   Encounter Date: 03/04/2021   PT End of Session - 03/04/21 0934     Visit Number 9    Number of Visits 12    Date for PT Re-Evaluation 03/08/21    PT Start Time 0930    PT Stop Time 1018    PT Time Calculation (min) 48 min    Activity Tolerance Patient tolerated treatment well             Past Medical History:  Diagnosis Date   No pertinent past medical history     Past Surgical History:  Procedure Laterality Date   KNEE ARTHROSCOPY Left 1982    There were no vitals filed for this visit.   Subjective Assessment - 03/04/21 0934     Subjective Patient reports that symptoms continue to improve. She was seen by Dr T and he is pleased with progress. Rhonda FifeLynn is pleased with her progress. She is continuing to work on [employer] and posture and alignment. She has her TENs unit and has used the unit at home.    Currently in Pain? No/denies    Pain Score 0-No pain                [facility] PT Assessment - 03/04/21 0001       Assessment   Medical Diagnosis Cervical DDD; UE radicular symptoms    Referring Provider (PT) Dr Benjamin Stainhekkekandam    Onset Date/Surgical Date 06/28/20    Hand Dominance Right    Next MD Visit PRN    Prior Therapy none      AROM   Cervical Flexion 62    Cervical Extension 68    Cervical - Right Side Bend 39    Cervical - Left Side Bend 38    Cervical - Right Rotation 75    Cervical - Left Rotation 71      Palpation   Spinal mobility hypomobile mid to upper thoracic and cervical spine with PA and lateral mobs    Palpation comment persistent muscular tightness Rt > Lt ant/lat/posterior cervical musculature; pecs; upper traps; leveator; teres                            [facility] Adult PT Treatment/Exercise - 03/04/21 0001       Shoulder Exercises: Standing   External Rotation Strengthening;Right;Left;20 reps;Theraband    Theraband Level (Shoulder External Rotation) Level 3 (Green)    Extension Strengthening;Both;20 reps;Theraband    Theraband Level (Shoulder Extension) Level 3 (Green)    Row Strengthening;Both;20 reps;Theraband    Theraband Level (Shoulder Row) Level 3 (Green)    Row Limitations bow and arrow x 20 each side green TB    Retraction Strengthening;Both;10 reps;Theraband    Theraband Level (Shoulder Retraction) Level 1 (Yellow)    Other Standing Exercises chin tuck 5 sec x 5 reps; scap squeeze 5 sec x 10; L's x 10; W's x 10 with noodle along spine      Shoulder Exercises: Stretch   Other Shoulder Stretches doorway 3 positions 20 sec hold x 2 reps each position; overdoor shouldre flexion stretch 30 sec x 2 reps holding dowel      Moist Heat Therapy   Number Minutes  Moist Heat 10 Minutes    Moist Heat Location Cervical;Shoulder      Electrical Stimulation   Electrical Stimulation Location bilat cervical    Electrical Stimulation Action TENs    Electrical Stimulation Parameters to tolerance    Electrical Stimulation Goals Tone      Manual Therapy   Joint Mobilization cervical PA and lateral mobs    Soft tissue mobilization deep tissue work through the pecs and posterior cervical musculature    Passive ROM lateral cervical flexion; cervical flexion 10-15 sec hold x 2 each position    Manual Traction cervical traction 10-15 sec throughout treatment 3-4 reps 10-15 sec hold                          PT Long Term Goals - 01/25/21 1456       PT LONG TERM GOAL #1   Title Improve posture and alignment with patient to demonstrate improved upright posture with posterior shoulder girdle engaged (middle trap and lower trap strength 4+/5 to 5/5)    Time 6    Status New    Target Date 03/08/21       PT LONG TERM GOAL #2   Title Decrease frequency, intensity and duration of radicular symptoms to allow patient to use UE's for all functional activities    Time 6    Period Weeks    Status New    Target Date 03/08/21      PT LONG TERM GOAL #3   Title Increase cervical ROM and mobilty to WFL's in all planes without pain/tightness    Time 6    Period Weeks    Target Date 03/08/21      PT LONG TERM GOAL #4   Title Independent in HEP with patient to demonstrate and/or verbalize proper posture for rest and functional activities    Time 6    Period Weeks    Status New    Target Date 03/08/21      PT LONG TERM GOAL #5   Title Improve functional assessment score to 70    Time 6    Period Weeks    Status New    Target Date 03/08/21                   Plan - 03/04/21 0959     Clinical Impression Statement Good proress overall. Continued muscular tightness noted through the Lt > Rt upper thoracic/upper trap area. Good response to manual work and stretching. will benefit from continued treatment to address muscular tightness in the Lt > Rt thoracic and upper trap musculature. may benefit from DN to this area if tightness persists.    Rehab Potential Good    PT Frequency 2x / week    PT Duration 6 weeks    PT Treatment/Interventions ADLs/Self Care Home Management;Aquatic Therapy;Cryotherapy;Electrical Stimulation;Iontophoresis 4mg /ml Dexamethasone;Moist Heat;Ultrasound;Therapeutic activities;Therapeutic exercise;Balance training;Neuromuscular re-education;Patient/family education;Manual techniques;Passive range of motion;Dry needling    PT Next Visit Plan review and progress HEP; neural stretch; continue manual work; assess response to dry needling; TENS/modalities as indicated    PT Home Exercise Plan [id number]    Consulted and Agree with Plan of Care Patient             Patient will benefit from skilled therapeutic intervention in order to improve the following deficits and  impairments:     Visit Diagnosis: DDD (degenerative disc disease), cervical  Other symptoms and signs involving the musculoskeletal  system  Abnormal posture  Muscle weakness (generalized)     Problem List Patient Active Problem List   Diagnosis Date Noted   DDD (degenerative disc disease), cervical 01/11/2021   Strain of abdominal muscle 02/07/2020   Nondisplaced fracture of the left third metatarsal base, comminuted fracture, nondisplaced of the left distal fibular shaft 02/04/2020    Matan Steen Rober Minion, PT, MPH  03/04/2021, 10:10 AM  [facility] [address] [place], [place], [address] Phone: [phone]   Fax:  [phone]  Name: Rhonda Wilkins MRN: [id number] Date of Birth: 1960-06-20

## 2021-03-05 LAB — LIPID PANEL W/REFLEX DIRECT LDL
Cholesterol: 251 mg/dL — ABNORMAL HIGH (ref <200)
HDL: 71 mg/dL (ref >=50)
LDL Cholesterol (Calc): 161 mg/dL (calc) — ABNORMAL HIGH
Non-HDL Cholesterol (Calc): 180 mg/dL (calc) — ABNORMAL HIGH (ref <130)
Total CHOL/HDL Ratio: 3.5 (calc) (ref <5.0)
Triglycerides: 87 mg/dL (ref <150)

## 2021-03-05 LAB — COMPLETE METABOLIC PANEL WITH GFR
AG Ratio: 1.9 (calc) (ref 1.0–2.5)
ALT: 30 U/L — ABNORMAL HIGH (ref 6–29)
AST: 21 U/L (ref 10–35)
Albumin: 4.6 g/dL (ref 3.6–5.1)
Alkaline phosphatase (APISO): 80 U/L (ref 37–153)
BUN: 9 mg/dL (ref 7–25)
CO2: 31 mmol/L (ref 20–32)
Calcium: 10 mg/dL (ref 8.6–10.4)
Chloride: 101 mmol/L (ref 98–110)
Creat: 0.65 mg/dL (ref 0.50–1.03)
Globulin: 2.4 g/dL (calc) (ref 1.9–3.7)
Glucose, Bld: 93 mg/dL (ref 65–99)
Potassium: 4.4 mmol/L (ref 3.5–5.3)
Sodium: 139 mmol/L (ref 135–146)
Total Bilirubin: 0.6 mg/dL (ref 0.2–1.2)
Total Protein: 7 g/dL (ref 6.1–8.1)
eGFR: 102 mL/min/{1.73_m2} (ref >=60)

## 2021-03-05 LAB — CBC WITH DIFFERENTIAL/PLATELET
Absolute Monocytes: 603 cells/uL (ref 200–950)
Basophils Absolute: 60 cells/uL (ref 0–200)
Basophils Relative: 0.9 %
Eosinophils Absolute: 355 cells/uL (ref 15–500)
Eosinophils Relative: 5.3 %
HCT: 45.3 % — ABNORMAL HIGH (ref 35.0–45.0)
Hemoglobin: 15.1 g/dL (ref 11.7–15.5)
Lymphs Abs: 2044 cells/uL (ref 850–3900)
MCH: 30.5 pg (ref 27.0–33.0)
MCHC: 33.3 g/dL (ref 32.0–36.0)
MCV: 91.5 fL (ref 80.0–100.0)
MPV: 11.2 fL (ref 7.5–12.5)
Monocytes Relative: 9 %
Neutro Abs: 3638 cells/uL (ref 1500–7800)
Neutrophils Relative %: 54.3 %
Platelets: 292 10*3/uL (ref 140–400)
RBC: 4.95 10*6/uL (ref 3.80–5.10)
RDW: 12.5 % (ref 11.0–15.0)
Total Lymphocyte: 30.5 %
WBC: 6.7 10*3/uL (ref 3.8–10.8)

## 2021-03-05 LAB — TSH: TSH: 3.04 mIU/L (ref 0.40–4.50)

## 2021-03-05 LAB — VITAMIN D 25 HYDROXY (VIT D DEFICIENCY, FRACTURES): Vit D, 25-Hydroxy: 21 ng/mL — ABNORMAL LOW (ref 30–100)

## 2021-03-05 LAB — VITAMIN B12: Vitamin B-12: 304 pg/mL (ref 200–1100)

## 2021-03-05 LAB — MAGNESIUM: Magnesium: 2.1 mg/dL (ref 1.5–2.5)

## 2021-03-09 ENCOUNTER — Encounter: Payer: Self-pay | Admitting: Rehabilitative and Restorative Service Providers"

## 2021-03-09 ENCOUNTER — Other Ambulatory Visit: Payer: Self-pay

## 2021-03-09 ENCOUNTER — Ambulatory Visit: Payer: Managed Care, Other (non HMO) | Admitting: Rehabilitative and Restorative Service Providers"

## 2021-03-09 DIAGNOSIS — M5030 Other cervical disc degeneration, unspecified cervical region: Secondary | ICD-10-CM

## 2021-03-09 DIAGNOSIS — M6281 Muscle weakness (generalized): Secondary | ICD-10-CM

## 2021-03-09 DIAGNOSIS — R29898 Other symptoms and signs involving the musculoskeletal system: Secondary | ICD-10-CM

## 2021-03-09 DIAGNOSIS — R293 Abnormal posture: Secondary | ICD-10-CM

## 2021-03-09 NOTE — Therapy (Addendum)
[facility] [place] [address] [place], [place], [address] Phone: [phone]   Fax:  [phone]  Physical Therapy Treatment and 10th visit Medicare note   Patient Details  Name: Rhonda Wilkins MRN: [id number] Date of Birth: 03/18/1960 Referring Provider (PT): Dr [name]   Encounter Date: 03/09/2021   PT End of Session - 03/09/21 0933     Visit Number 10    Number of Visits 22    Date for PT Re-Evaluation 04/20/21    PT Start Time 0930    PT Stop Time 1015    PT Time Calculation (min) 45 min    Activity Tolerance Patient tolerated treatment well             Past Medical History:  Diagnosis Date   No pertinent past medical history     Past Surgical History:  Procedure Laterality Date   KNEE ARTHROSCOPY Left 1982    There were no vitals filed for this visit.   Subjective Assessment - 03/09/21 0934     Subjective Doing well. No compression in arms. She has had some intermittent numbness and tingling in the UE's. Has made progress with symptoms. Did not sleep well. Patient is progressing well with rehab and will benefit from continued treatment to achieve maximum rehab potential.    Currently in Pain? No/denies    Pain Score 0-No pain    Pain Location Neck                [facility] PT Assessment - 03/09/21 0001       Assessment   Medical Diagnosis Cervical DDD; UE radicular symptoms    Referring Provider (PT) Dr [name]    Onset Date/Surgical Date 06/28/20    Hand Dominance Right    Next MD Visit PRN    Prior Therapy none      AROM   Cervical Flexion 68    Cervical Extension 63    Cervical - Right Side Bend 38    Cervical - Left Side Bend 41    Cervical - Right Rotation 76    Cervical - Left Rotation 70      Palpation   Spinal mobility hypomobile mid to upper thoracic and cervical spine with PA and lateral mobs    Palpation comment persistent muscular tightness Rt > Lt  ant/lat/posterior cervical musculature; pecs; upper traps; leveator; teres                           [facility] Adult PT Treatment/Exercise - 03/09/21 0001       Neck Exercises: Seated   Neck Retraction 5 reps;5 secs    Cervical Rotation Right;Left;5 reps    Lateral Flexion Right;Left;5 reps    Shoulder Rolls Backwards;5 reps      Shoulder Exercises: Standing   Other Standing Exercises chin tuck 5 sec x 5 reps; scap squeeze 5 sec x 10; L's x 10; W's x 10 with noodle along spine      Shoulder Exercises: Stretch   Other Shoulder Stretches doorway 3 positions 20 sec hold x 2 reps each position; overdoor shouldre flexion stretch 30 sec x 2 reps holding dowel      Moist Heat Therapy   Number Minutes Moist Heat 10 Minutes    Moist Heat Location Cervical;Shoulder      Electrical Stimulation   Electrical Stimulation Location bilat cervical; upper trap    Electrical Stimulation Action TENS  Electrical Stimulation Parameters to tolerance    Electrical Stimulation Goals Tone      Manual Therapy   Manual therapy comments skilled palpation to assess response to DN and manual work    Joint Mobilization cervical PA and lateral mobs    Soft tissue mobilization deep tissue work through the pecs and anterior/lateral/posterior cervical musculature              Trigger Point Dry Needling - 03/09/21 0001     Consent Given? Yes    Education Handout Provided Previously provided    Other Dry Needling bilat    Upper Trapezius Response Palpable increased muscle length    Scalenes Response Palpable increased muscle length   Lt   Cervical multifidi Response Palpable increased muscle length                        PT Long Term Goals - 03/09/21 1010       PT LONG TERM GOAL #1   Title Improve posture and alignment with patient to demonstrate improved upright posture with posterior shoulder girdle engaged (middle trap and lower trap strength 4+/5 to 5/5)    Time 6     Period Weeks    Status On-going    Target Date 04/20/21      PT LONG TERM GOAL #2   Title Decrease frequency, intensity and duration of radicular symptoms to allow patient to use UE's for all functional activities    Time 6    Period Weeks    Status On-going    Target Date 04/20/21      PT LONG TERM GOAL #3   Title Increase cervical ROM and mobilty to WFL's in all planes without pain/tightness    Time 6    Period Weeks    Status On-going    Target Date 04/20/21      PT LONG TERM GOAL #4   Title Independent in HEP with patient to demonstrate and/or verbalize proper posture for rest and functional activities    Time 6    Period Weeks    Status On-going    Target Date 04/20/21      PT LONG TERM GOAL #5   Title Improve functional assessment score to 70    Time 6    Period Weeks    Status On-going    Target Date 04/20/21                   Plan - 03/09/21 0939     Clinical Impression Statement Continued progress with UE symptoms - has persistent tightness in the Lt > Rt upper trap. Good gains in AROM cervical motions. Good response to treatment. Trail of DN and manual work to upper trap and cervical musculature tolerated well with good release of muscular tightness noted.    Rehab Potential Good    PT Frequency 2x / week    PT Duration 6 weeks    PT Treatment/Interventions ADLs/Self Care Home Management;Aquatic Therapy;Cryotherapy;Electrical Stimulation;Iontophoresis 4mg /ml Dexamethasone;Moist Heat;Ultrasound;Therapeutic activities;Therapeutic exercise;Balance training;Neuromuscular re-education;Patient/family education;Manual techniques;Passive range of motion;Dry needling    PT Next Visit Plan review and progress HEP; neural stretch; continue manual work; assess response to dry needling; TENS/modalities as indicated    PT Home Exercise Plan [id number]    Consulted and Agree with Plan of Care Patient             Patient will benefit from skilled therapeutic  intervention in order to improve  the following deficits and impairments:     Visit Diagnosis: DDD (degenerative disc disease), cervical - Plan: PT plan of care cert/re-cert  Other symptoms and signs involving the musculoskeletal system - Plan: PT plan of care cert/re-cert  Abnormal posture - Plan: PT plan of care cert/re-cert  Muscle weakness (generalized) - Plan: PT plan of care cert/re-cert     Problem List Patient Active Problem List   Diagnosis Date Noted   Colon cancer screening 03/04/2021   Brittle nails 03/04/2021   DDD (degenerative disc disease), cervical 01/11/2021   Strain of abdominal muscle 02/07/2020   Nondisplaced fracture of the left third metatarsal base, comminuted fracture, nondisplaced of the left distal fibular shaft 02/04/2020    Lazara Grieser Rober Minion, PT, MPH  03/09/2021, 10:17 AM  [facility] [address] [place], [place], [address] Phone: [phone]   Fax:  [phone]  Name: Rhonda Wilkins MRN: [id number] Date of Birth: 1961-01-01

## 2021-03-12 ENCOUNTER — Encounter: Payer: Self-pay | Admitting: Rehabilitative and Restorative Service Providers"

## 2021-03-12 ENCOUNTER — Ambulatory Visit: Payer: Managed Care, Other (non HMO) | Admitting: Rehabilitative and Restorative Service Providers"

## 2021-03-12 ENCOUNTER — Other Ambulatory Visit: Payer: Self-pay

## 2021-03-12 DIAGNOSIS — R29898 Other symptoms and signs involving the musculoskeletal system: Secondary | ICD-10-CM

## 2021-03-12 DIAGNOSIS — M6281 Muscle weakness (generalized): Secondary | ICD-10-CM

## 2021-03-12 DIAGNOSIS — M5030 Other cervical disc degeneration, unspecified cervical region: Secondary | ICD-10-CM | POA: Diagnosis not present

## 2021-03-12 DIAGNOSIS — R293 Abnormal posture: Secondary | ICD-10-CM

## 2021-03-12 NOTE — Therapy (Signed)
[facility] [address] [place] [address] [place], [place], [address] Phone: [phone]   Fax:  [phone]  Physical Therapy Treatment  Patient Details  Name: Rhonda Wilkins MRN: [id number] Date of Birth: 12/31/1960 Referring Provider (PT): Dr Benjamin Stainhekkekandam   Encounter Date: 03/12/2021   PT End of Session - 03/12/21 0801     Visit Number 11    Number of Visits 22    Date for PT Re-Evaluation 04/20/21    PT Start Time 0800    PT Stop Time 0848    PT Time Calculation (min) 48 min    Activity Tolerance Patient tolerated treatment well             Past Medical History:  Diagnosis Date   No pertinent past medical history     Past Surgical History:  Procedure Laterality Date   KNEE ARTHROSCOPY Left 1982    There were no vitals filed for this visit.   Subjective Assessment - 03/12/21 0801     Subjective Patient reports that she was sore followinf Dn and manual work but had no numbness or tingling in her hands yesterday!    Currently in Pain? No/denies    Pain Score 0-No pain                               [facility] Adult PT Treatment/Exercise - 03/12/21 0001       Neck Exercises: Seated   Neck Retraction 5 reps;5 secs    Cervical Rotation Right;Left;5 reps    Lateral Flexion Right;Left;5 reps    Shoulder Rolls Backwards;5 reps      Shoulder Exercises: Standing   External Rotation Strengthening;Right;Left;20 reps;Theraband    Theraband Level (Shoulder External Rotation) Level 3 (Green)    Extension Strengthening;Both;20 reps;Theraband    Theraband Level (Shoulder Extension) Level 3 (Green)    Row Strengthening;Both;20 reps;Theraband    Theraband Level (Shoulder Row) Level 3 (Green)    Row Limitations bow and arrow x 20 each side green TB    Retraction Strengthening;Both;10 reps;Theraband    Theraband Level (Shoulder Retraction) Level 2 (Red)    Other Standing Exercises chin tuck 5 sec x 5 reps;  scap squeeze 5 sec x 10; L's x 10; W's x 10 with noodle along spine    Other Standing Exercises serratus activation red TB x 15 reps      Shoulder Exercises: Stretch   Other Shoulder Stretches doorway 3 positions 20 sec hold x 2 reps each position; overdoor shouldre flexion stretch 30 sec x 2 reps holding dowel      Moist Heat Therapy   Number Minutes Moist Heat 10 Minutes    Moist Heat Location Cervical;Shoulder      Electrical Stimulation   Electrical Stimulation Location bilat cervical; upper trap    Electrical Stimulation Action TENS    Electrical Stimulation Parameters to tolerance    Electrical Stimulation Goals Tone      Manual Therapy   Manual therapy comments skilled palpation to assess response to DN and manual work    Joint Mobilization cervical PA and lateral mobs    Soft tissue mobilization deep tissue work through the pecs and anterior/lateral/posterior cervical musculature              Trigger Point Dry Needling - 03/12/21 0001     Consent Given? Yes    Education Handout Provided Previously provided    Other Dry Needling  bilat    Upper Trapezius Response Palpable increased muscle length    Scalenes Response Palpable increased muscle length   Lt   Cervical multifidi Response Palpable increased muscle length                   PT Education - 03/12/21 0843     Education Details HEP    Person(s) Educated Patient    Methods Explanation;Demonstration;Tactile cues;Verbal cues;Handout    Comprehension Verbalized understanding;Returned demonstration;Verbal cues required;Tactile cues required                 PT Long Term Goals - 03/09/21 1010       PT LONG TERM GOAL #1   Title Improve posture and alignment with patient to demonstrate improved upright posture with posterior shoulder girdle engaged (middle trap and lower trap strength 4+/5 to 5/5)    Time 6    Period Weeks    Status On-going    Target Date 04/20/21      PT LONG TERM GOAL #2    Title Decrease frequency, intensity and duration of radicular symptoms to allow patient to use UE's for all functional activities    Time 6    Period Weeks    Status On-going    Target Date 04/20/21      PT LONG TERM GOAL #3   Title Increase cervical ROM and mobilty to WFL's in all planes without pain/tightness    Time 6    Period Weeks    Status On-going    Target Date 04/20/21      PT LONG TERM GOAL #4   Title Independent in HEP with patient to demonstrate and/or verbalize proper posture for rest and functional activities    Time 6    Period Weeks    Status On-going    Target Date 04/20/21      PT LONG TERM GOAL #5   Title Improve functional assessment score to 70    Time 6    Period Weeks    Status On-going    Target Date 04/20/21                   Plan - 03/12/21 0805     Clinical Impression Statement Good response to treatment including DN and manual work. Patient continues to report improvement in radicular symptoms.    Rehab Potential Good    PT Frequency 2x / week    PT Duration 6 weeks    PT Treatment/Interventions ADLs/Self Care Home Management;Aquatic Therapy;Cryotherapy;Electrical Stimulation;Iontophoresis 4mg /ml Dexamethasone;Moist Heat;Ultrasound;Therapeutic activities;Therapeutic exercise;Balance training;Neuromuscular re-education;Patient/family education;Manual techniques;Passive range of motion;Dry needling    PT Next Visit Plan review and progress HEP; neural stretch; continue manual work; continue dry needling as indicated; TENS/modalities as indicated    PT Home Exercise Plan [email or web address]    Consulted and Agree with Plan of Care Patient             Patient will benefit from skilled therapeutic intervention in order to improve the following deficits and impairments:     Visit Diagnosis: DDD (degenerative disc disease), cervical  Other symptoms and signs involving the musculoskeletal system  Abnormal posture  Muscle weakness  (generalized)     Problem List Patient Active Problem List   Diagnosis Date Noted   Colon cancer screening 03/04/2021   Brittle nails 03/04/2021   DDD (degenerative disc disease), cervical 01/11/2021   Strain of abdominal muscle 02/07/2020   Nondisplaced fracture of the left third metatarsal base, comminuted  fracture, nondisplaced of the left distal fibular shaft 02/04/2020    Zael Shuman Rober Minion, PT, MPH 03/12/2021, 8:44 AM  [facility] [address] [place] [address] [place], [place], [address] Phone: [phone]   Fax:  [phone]  Name: Rhonda Wilkins MRN: [id number] Date of Birth: [birth year]

## 2021-03-12 NOTE — Patient Instructions (Signed)
Access Code: [email or web address] URL: [email or web address] Date: 03/12/2021 Prepared by: Corlis Leak  Exercises Seated Cervical Retraction - 3 x daily - 7 x weekly - 1 sets - 10 reps Standing Scapular Retraction - 3 x daily - 7 x weekly - 1 sets - 10 reps - 10 hold Shoulder External Rotation and Scapular Retraction - 3 x daily - 7 x weekly - 1 sets - 10 reps - 3-5 sec hold Shoulder External Rotation in 45 Degrees Abduction - 2 x daily - 7 x weekly - 1-2 sets - 10 reps - 3 sec hold Doorway Pec Stretch at 60 Degrees Abduction - 3 x daily - 7 x weekly - 1 sets - 3 reps Doorway Pec Stretch at 90 Degrees Abduction - 3 x daily - 7 x weekly - 1 sets - 3 reps - 30 seconds hold Doorway Pec Stretch at 120 Degrees Abduction - 3 x daily - 7 x weekly - 1 sets - 3 reps - 30 second hold hold Shoulder External Rotation and Scapular Retraction with Resistance - 2 x daily - 7 x weekly - 3 sets - 10 reps - 3 hold Scapular Retraction with Resistance - 2 x daily - 7 x weekly - 3 sets - 10 reps - 3 hold Scapular Retraction with Resistance Advanced - 2 x daily - 7 x weekly - 3 sets - 10 reps - 3 hold Shoulder External Rotation with Resistance - 2 x daily - 7 x weekly - 1 sets - 10 reps - 3 sec hold Drawing Bow - 1 x daily - 7 x weekly - 1 sets - 10 reps - 3 sec hold Shoulder External Rotation with Anchored Resistance with Towel Under Elbow - 1 x daily - 7 x weekly - 1-2 sets - 10 reps - 3 sec hold Standing Shoulder Flexion Stretch on Wall - 2 x daily - 7 x weekly - 2 sets - 10 reps - 3 sec hold Shoulder Flexion Serratus Activation with Resistance - 1 x daily - 7 x weekly - 1 sets - 10 reps - 3-5 sec hold

## 2021-03-15 ENCOUNTER — Encounter: Payer: Managed Care, Other (non HMO) | Admitting: Rehabilitative and Restorative Service Providers"

## 2021-03-16 ENCOUNTER — Ambulatory Visit: Payer: Managed Care, Other (non HMO) | Admitting: Rehabilitative and Restorative Service Providers"

## 2021-03-16 ENCOUNTER — Other Ambulatory Visit: Payer: Self-pay

## 2021-03-16 ENCOUNTER — Encounter: Payer: Self-pay | Admitting: Rehabilitative and Restorative Service Providers"

## 2021-03-16 DIAGNOSIS — M6281 Muscle weakness (generalized): Secondary | ICD-10-CM

## 2021-03-16 DIAGNOSIS — R293 Abnormal posture: Secondary | ICD-10-CM

## 2021-03-16 DIAGNOSIS — M5030 Other cervical disc degeneration, unspecified cervical region: Secondary | ICD-10-CM | POA: Diagnosis not present

## 2021-03-16 DIAGNOSIS — R29898 Other symptoms and signs involving the musculoskeletal system: Secondary | ICD-10-CM

## 2021-03-16 NOTE — Therapy (Signed)
[facility] [place] [address] [place], [place], [address] Phone: [phone]   Fax:  [phone]  Physical Therapy Treatment  Patient Details  Name: Rhonda Wilkins MRN: [id number] Date of Birth: 03-09-1960 Referring Provider (PT): Dr [name]   Encounter Date: 03/16/2021   PT End of Session - 03/16/21 1017     Visit Number 12    Number of Visits 22    Date for PT Re-Evaluation 04/20/21    PT Start Time 1015    PT Stop Time 1104    PT Time Calculation (min) 49 min    Activity Tolerance Patient tolerated treatment well             Past Medical History:  Diagnosis Date   No pertinent past medical history     Past Surgical History:  Procedure Laterality Date   KNEE ARTHROSCOPY Left 1982    There were no vitals filed for this visit.   Subjective Assessment - 03/16/21 1017     Subjective Not as sore following last treatment. Feeling looser and not as much numbing in arms. Working on exercises everyday - some exercises everyday but switches them up.    Currently in Pain? No/denies    Pain Score 0-No pain    Pain Location Neck                [facility] PT Assessment - 03/16/21 0001       Assessment   Medical Diagnosis Cervical DDD; UE radicular symptoms    Referring Provider (PT) Dr [name]    Onset Date/Surgical Date 06/28/20    Hand Dominance Right    Next MD Visit PRN    Prior Therapy none      AROM   Overall AROM Comments shoulder AROM WFL's bilat      Palpation   Spinal mobility hypomobile mid to upper thoracic and cervical spine with PA and lateral mobs    Palpation comment persistent but decreasing muscular tightness Rt > Lt ant/lat/posterior cervical musculature; pecs; upper traps; leveator; teres                           [facility] Adult PT Treatment/Exercise - 03/16/21 0001       Neck Exercises: Seated   Neck Retraction 5 reps;5 secs    Cervical Rotation  Right;Left;5 reps    Lateral Flexion Right;Left;5 reps    Shoulder Rolls Backwards;5 reps      Shoulder Exercises: Standing   Other Standing Exercises chin tuck 5 sec x 5 reps; scap squeeze 5 sec x 10; L's x 10; W's x 10 with noodle along spine    Other Standing Exercises serratus activation red TB x 15 reps      Shoulder Exercises: Stretch   Other Shoulder Stretches doorway 3 positions 20 sec hold x 2 reps each position; overdoor shouldre flexion stretch 30 sec x 2 reps holding dowel - added slight rotation with doorway stretch to increase stretch through the pecs      Moist Heat Therapy   Number Minutes Moist Heat 10 Minutes    Moist Heat Location Cervical;Shoulder      Manual Therapy   Manual therapy comments skilled palpation to assess response to DN and manual work    Joint Mobilization cervical PA and lateral mobs    Soft tissue mobilization deep tissue work through the pecs and anterior/lateral/posterior cervical musculature  Trigger Point Dry Needling - 03/16/21 0001     Consent Given? Yes    Education Handout Provided Previously provided    Other Dry Needling Lt    Upper Trapezius Response Palpable increased muscle length    Scalenes Response Palpable increased muscle length    Cervical multifidi Response Palpable increased muscle length                        PT Long Term Goals - 03/09/21 1010       PT LONG TERM GOAL #1   Title Improve posture and alignment with patient to demonstrate improved upright posture with posterior shoulder girdle engaged (middle trap and lower trap strength 4+/5 to 5/5)    Time 6    Period Weeks    Status On-going    Target Date 04/20/21      PT LONG TERM GOAL #2   Title Decrease frequency, intensity and duration of radicular symptoms to allow patient to use UE's for all functional activities    Time 6    Period Weeks    Status On-going    Target Date 04/20/21      PT LONG TERM GOAL #3   Title  Increase cervical ROM and mobilty to WFL's in all planes without pain/tightness    Time 6    Period Weeks    Status On-going    Target Date 04/20/21      PT LONG TERM GOAL #4   Title Independent in HEP with patient to demonstrate and/or verbalize proper posture for rest and functional activities    Time 6    Period Weeks    Status On-going    Target Date 04/20/21      PT LONG TERM GOAL #5   Title Improve functional assessment score to 70    Time 6    Period Weeks    Status On-going    Target Date 04/20/21                   Plan - 03/16/21 1020     Clinical Impression Statement Great progress with DN and manual work with resolution of numbness in bilat UE's since last treatment. Added sight trunk rotation in doorway stretches to increase tissue extensibility through the anterior chest. Patient continues to demonstrate improving cervical mobility with decreasing palpable tightness throughout. Rhonda FifeLynn is using TENS unit at home with good results and no problems.    Rehab Potential Good    PT Frequency 2x / week    PT Duration 6 weeks    PT Treatment/Interventions ADLs/Self Care Home Management;Aquatic Therapy;Cryotherapy;Electrical Stimulation;Iontophoresis 4mg /ml Dexamethasone;Moist Heat;Ultrasound;Therapeutic activities;Therapeutic exercise;Balance training;Neuromuscular re-education;Patient/family education;Manual techniques;Passive range of motion;Dry needling    PT Next Visit Plan review and progress HEP; neural stretch; continue manual work; continue dry needling as indicated; TENS/modalities as indicated    PT Home Exercise Plan [email or web address]    Consulted and Agree with Plan of Care Patient             Patient will benefit from skilled therapeutic intervention in order to improve the following deficits and impairments:     Visit Diagnosis: DDD (degenerative disc disease), cervical  Other symptoms and signs involving the musculoskeletal system  Abnormal  posture  Muscle weakness (generalized)     Problem List Patient Active Problem List   Diagnosis Date Noted   Colon cancer screening 03/04/2021   Brittle nails 03/04/2021   DDD (degenerative disc disease), cervical  01/11/2021   Strain of abdominal muscle 02/07/2020   Nondisplaced fracture of the left third metatarsal base, comminuted fracture, nondisplaced of the left distal fibular shaft 02/04/2020    Tuyen Uncapher Rober Minion, PT, MPH  03/16/2021, 10:55 AM  [facility] [address] [place] [address] [place], [place], [address] Phone: [phone]   Fax:  [phone]  Name: Rhonda Wilkins MRN: [id number] Date of Birth: Jul 14, 1960

## 2021-03-17 ENCOUNTER — Ambulatory Visit (INDEPENDENT_AMBULATORY_CARE_PROVIDER_SITE_OTHER): Payer: Managed Care, Other (non HMO)

## 2021-03-17 DIAGNOSIS — Z1231 Encounter for screening mammogram for malignant neoplasm of breast: Secondary | ICD-10-CM

## 2021-03-18 ENCOUNTER — Encounter: Payer: Self-pay | Admitting: Rehabilitative and Restorative Service Providers"

## 2021-03-18 ENCOUNTER — Other Ambulatory Visit: Payer: Self-pay

## 2021-03-18 ENCOUNTER — Ambulatory Visit: Payer: Managed Care, Other (non HMO) | Admitting: Rehabilitative and Restorative Service Providers"

## 2021-03-18 DIAGNOSIS — M5030 Other cervical disc degeneration, unspecified cervical region: Secondary | ICD-10-CM

## 2021-03-18 DIAGNOSIS — R29898 Other symptoms and signs involving the musculoskeletal system: Secondary | ICD-10-CM

## 2021-03-18 DIAGNOSIS — R293 Abnormal posture: Secondary | ICD-10-CM

## 2021-03-18 DIAGNOSIS — M6281 Muscle weakness (generalized): Secondary | ICD-10-CM

## 2021-03-18 NOTE — Therapy (Signed)
[facility] [address] [place] [address] [place], [place], [address] Phone: [phone]   Fax:  [phone]  Physical Therapy Treatment  Patient Details  Name: Rhonda Wilkins MRN: [id number] Date of Birth: [birth year] Referring Provider (PT): Dr Benjamin Stainhekkekandam   Encounter Date: 03/18/2021   PT End of Session - 03/18/21 0934     Visit Number 13    Number of Visits 22    Date for PT Re-Evaluation 04/20/21    PT Start Time 0932    PT Stop Time 1020    PT Time Calculation (min) 48 min    Activity Tolerance Patient tolerated treatment well             Past Medical History:  Diagnosis Date   No pertinent past medical history     Past Surgical History:  Procedure Laterality Date   KNEE ARTHROSCOPY Left 1982    There were no vitals filed for this visit.   Subjective Assessment - 03/18/21 0934     Subjective No numbness in her hands and arms this week. Some soreness in the evening after treatment but has done well with the DN    Currently in Pain? No/denies    Pain Score 0-No pain                               [facility] Adult PT Treatment/Exercise - 03/18/21 0001       Neck Exercises: Seated   Neck Retraction 5 reps;5 secs    Cervical Rotation Right;Left;5 reps    Lateral Flexion Right;Left;5 reps    Shoulder Rolls Backwards;5 reps      Shoulder Exercises: Standing   External Rotation Strengthening;Right;Left;10 reps;Theraband    Theraband Level (Shoulder External Rotation) Level 3 (Green)    Extension Strengthening;Both;20 reps;Theraband    Theraband Level (Shoulder Extension) Level 3 (Green)    Row Strengthening;Both;20 reps;Theraband    Theraband Level (Shoulder Row) Level 3 (Green)    Row Limitations bow and arrow x 20 each side green TB    Retraction Strengthening;Both;10 reps;Theraband    Theraband Level (Shoulder Retraction) Level 2 (Red)    Other Standing Exercises chin tuck 5 sec x 5 reps;  scap squeeze 5 sec x 10; L's x 10; W's x 10 with noodle along spine    Other Standing Exercises serratus activation red TB x 15 reps; shoulder clock x 10 each side      Shoulder Exercises: Stretch   Other Shoulder Stretches doorway 3 positions 20 sec hold x 2 reps each position; overdoor shouldre flexion stretch 30 sec x 2 reps holding dowel - added slight rotation with doorway stretch to increase stretch through the pecs      Moist Heat Therapy   Number Minutes Moist Heat 10 Minutes    Moist Heat Location Cervical;Shoulder      Electrical Stimulation   Electrical Stimulation Location pt's TENs unit - no charge      Manual Therapy   Manual therapy comments skilled palpation to assess response to DN and manual work    Joint Mobilization cervical PA and lateral mobs    Soft tissue mobilization deep tissue work through the pecs and anterior/lateral/posterior cervical musculature              Trigger Point Dry Needling - 03/18/21 0001     Consent Given? Yes    Education Handout Provided Previously provided    Other Dry  Needling bilat    Scalenes Response Palpable increased muscle length                   PT Education - 03/18/21 0944     Education Details HEP    Person(s) Educated Patient    Methods Explanation;Demonstration;Tactile cues;Verbal cues;Handout    Comprehension Verbalized understanding;Returned demonstration;Verbal cues required;Tactile cues required                 PT Long Term Goals - 03/09/21 1010       PT LONG TERM GOAL #1   Title Improve posture and alignment with patient to demonstrate improved upright posture with posterior shoulder girdle engaged (middle trap and lower trap strength 4+/5 to 5/5)    Time 6    Period Weeks    Status On-going    Target Date 04/20/21      PT LONG TERM GOAL #2   Title Decrease frequency, intensity and duration of radicular symptoms to allow patient to use UE's for all functional activities    Time 6     Period Weeks    Status On-going    Target Date 04/20/21      PT LONG TERM GOAL #3   Title Increase cervical ROM and mobilty to WFL's in all planes without pain/tightness    Time 6    Period Weeks    Status On-going    Target Date 04/20/21      PT LONG TERM GOAL #4   Title Independent in HEP with patient to demonstrate and/or verbalize proper posture for rest and functional activities    Time 6    Period Weeks    Status On-going    Target Date 04/20/21      PT LONG TERM GOAL #5   Title Improve functional assessment score to 70    Time 6    Period Weeks    Status On-going    Target Date 04/20/21                   Plan - 03/18/21 0935     Clinical Impression Statement Progressing well with treatment with no numbness in UE's in about a week. Patient continues to have some muscular tightness in the scaleni and deep cervical musculature but she continues to improve with treatment and HEP. Progressing well toward goals of rehab.    Rehab Potential Good    PT Frequency 2x / week    PT Duration 6 weeks    PT Treatment/Interventions ADLs/Self Care Home Management;Aquatic Therapy;Cryotherapy;Electrical Stimulation;Iontophoresis 4mg /ml Dexamethasone;Moist Heat;Ultrasound;Therapeutic activities;Therapeutic exercise;Balance training;Neuromuscular re-education;Patient/family education;Manual techniques;Passive range of motion;Dry needling    PT Next Visit Plan review and progress HEP; neural stretch; continue manual work; continue dry needling as indicated; TENS/modalities as indicated    PT Home Exercise Plan [email or web address]    Consulted and Agree with Plan of Care Patient             Patient will benefit from skilled therapeutic intervention in order to improve the following deficits and impairments:     Visit Diagnosis: DDD (degenerative disc disease), cervical  Other symptoms and signs involving the musculoskeletal system  Abnormal posture  Muscle weakness  (generalized)     Problem List Patient Active Problem List   Diagnosis Date Noted   Colon cancer screening 03/04/2021   Brittle nails 03/04/2021   DDD (degenerative disc disease), cervical 01/11/2021   Strain of abdominal muscle 02/07/2020   Nondisplaced fracture of the left  third metatarsal base, comminuted fracture, nondisplaced of the left distal fibular shaft 02/04/2020    Shelva Hetzer Rober Minion, PT, MPH  03/18/2021, 10:14 AM  [facility] [address] [place] [address] [place], [place], [address] Phone: [phone]   Fax:  [phone]  Name: Zamantha Strebel MRN: [id number] Date of Birth: [birth year]

## 2021-03-18 NOTE — Patient Instructions (Signed)
Access Code: [email or web address] URL: [email or web address] Date: 03/18/2021 Prepared by: Gillermo Murdoch  Exercises Seated Cervical Retraction - 3 x daily - 7 x weekly - 1 sets - 10 reps Standing Scapular Retraction - 3 x daily - 7 x weekly - 1 sets - 10 reps - 10 hold Shoulder External Rotation and Scapular Retraction - 3 x daily - 7 x weekly - 1 sets - 10 reps - 3-5 sec hold Shoulder External Rotation in 45 Degrees Abduction - 2 x daily - 7 x weekly - 1-2 sets - 10 reps - 3 sec hold Doorway Pec Stretch at 60 Degrees Abduction - 3 x daily - 7 x weekly - 1 sets - 3 reps Doorway Pec Stretch at 90 Degrees Abduction - 3 x daily - 7 x weekly - 1 sets - 3 reps - 30 seconds hold Doorway Pec Stretch at 120 Degrees Abduction - 3 x daily - 7 x weekly - 1 sets - 3 reps - 30 second hold hold Shoulder External Rotation and Scapular Retraction with Resistance - 2 x daily - 7 x weekly - 3 sets - 10 reps - 3 hold Scapular Retraction with Resistance - 2 x daily - 7 x weekly - 3 sets - 10 reps - 3 hold Scapular Retraction with Resistance Advanced - 2 x daily - 7 x weekly - 3 sets - 10 reps - 3 hold Shoulder External Rotation with Resistance - 2 x daily - 7 x weekly - 1 sets - 10 reps - 3 sec hold Drawing Bow - 1 x daily - 7 x weekly - 1 sets - 10 reps - 3 sec hold Shoulder External Rotation with Anchored Resistance with Towel Under Elbow - 1 x daily - 7 x weekly - 1-2 sets - 10 reps - 3 sec hold Standing Shoulder Flexion Stretch on Wall - 2 x daily - 7 x weekly - 2 sets - 10 reps - 3 sec hold Shoulder Flexion Serratus Activation with Resistance - 1 x daily - 7 x weekly - 1 sets - 10 reps - 3-5 sec hold Wall Clock with Theraband - 1 x daily - 7 x weekly - 1 sets - 10 reps - 2-3 sec hold

## 2021-03-26 ENCOUNTER — Ambulatory Visit: Payer: Managed Care, Other (non HMO) | Admitting: Rehabilitative and Restorative Service Providers"

## 2021-03-26 ENCOUNTER — Other Ambulatory Visit: Payer: Self-pay

## 2021-03-26 ENCOUNTER — Encounter: Payer: Self-pay | Admitting: Rehabilitative and Restorative Service Providers"

## 2021-03-26 DIAGNOSIS — R293 Abnormal posture: Secondary | ICD-10-CM

## 2021-03-26 DIAGNOSIS — M5030 Other cervical disc degeneration, unspecified cervical region: Secondary | ICD-10-CM | POA: Diagnosis not present

## 2021-03-26 DIAGNOSIS — R29898 Other symptoms and signs involving the musculoskeletal system: Secondary | ICD-10-CM

## 2021-03-26 DIAGNOSIS — M6281 Muscle weakness (generalized): Secondary | ICD-10-CM

## 2021-03-26 NOTE — Patient Instructions (Signed)
Access Code: [email or web address] URL: [email or web address] Date: 03/26/2021 Prepared by: Corlis Leak  Exercises Seated Cervical Retraction - 3 x daily - 7 x weekly - 1 sets - 10 reps Standing Scapular Retraction - 3 x daily - 7 x weekly - 1 sets - 10 reps - 10 hold Shoulder External Rotation and Scapular Retraction - 3 x daily - 7 x weekly - 1 sets - 10 reps - 3-5 sec hold Shoulder External Rotation in 45 Degrees Abduction - 2 x daily - 7 x weekly - 1-2 sets - 10 reps - 3 sec hold Doorway Pec Stretch at 60 Degrees Abduction - 3 x daily - 7 x weekly - 1 sets - 3 reps Doorway Pec Stretch at 90 Degrees Abduction - 3 x daily - 7 x weekly - 1 sets - 3 reps - 30 seconds hold Doorway Pec Stretch at 120 Degrees Abduction - 3 x daily - 7 x weekly - 1 sets - 3 reps - 30 second hold hold Shoulder External Rotation and Scapular Retraction with Resistance - 2 x daily - 7 x weekly - 3 sets - 10 reps - 3 hold Scapular Retraction with Resistance - 2 x daily - 7 x weekly - 3 sets - 10 reps - 3 hold Scapular Retraction with Resistance Advanced - 2 x daily - 7 x weekly - 3 sets - 10 reps - 3 hold Shoulder External Rotation with Resistance - 2 x daily - 7 x weekly - 1 sets - 10 reps - 3 sec hold Drawing Bow - 1 x daily - 7 x weekly - 1 sets - 10 reps - 3 sec hold Shoulder External Rotation with Anchored Resistance with Towel Under Elbow - 1 x daily - 7 x weekly - 1-2 sets - 10 reps - 3 sec hold Standing Shoulder Flexion Stretch on Wall - 2 x daily - 7 x weekly - 2 sets - 10 reps - 3 sec hold Shoulder Flexion Serratus Activation with Resistance - 1 x daily - 7 x weekly - 1 sets - 10 reps - 3-5 sec hold Wall Clock with Theraband - 1 x daily - 7 x weekly - 1 sets - 10 reps - 2-3 sec hold Standing Lat Pull Down with Resistance - Elbows Bent - 2 x daily - 7 x weekly - 1 sets - 10 reps - 3 sec hold Anti-Rotation Lateral Stepping with Press - 2 x daily - 7 x weekly - 1-2 sets - 10 reps - 2-3 sec hold

## 2021-03-26 NOTE — Therapy (Signed)
[facility] [place] [address] [place], [place], [address] Phone: [phone]   Fax:  [phone]  Physical Therapy Treatment  Patient Details  Name: Rhonda Wilkins MRN: [id number] Date of Birth: 21-Dec-1960 Referring Provider (PT): Dr [name]   Encounter Date: 03/26/2021   PT End of Session - 03/26/21 0940     Visit Number 14    Number of Visits 22    Date for PT Re-Evaluation 04/20/21    PT Start Time 0935    PT Stop Time 1024    PT Time Calculation (min) 49 min    Activity Tolerance Patient tolerated treatment well             Past Medical History:  Diagnosis Date   No pertinent past medical history     Past Surgical History:  Procedure Laterality Date   KNEE ARTHROSCOPY Left 1982    There were no vitals filed for this visit.   Subjective Assessment - 03/26/21 0941     Subjective She did have some intermittent numbness in her hands and arms this week. No compression.  Some soreness in the evening after treatment but has done well with the DN    Currently in Pain? No/denies    Pain Score 0-No pain    Pain Location Neck                [facility] PT Assessment - 03/26/21 0001       Assessment   Medical Diagnosis Cervical DDD; UE radicular symptoms    Referring Provider (PT) Dr [name]    Onset Date/Surgical Date 06/28/20    Hand Dominance Right    Next MD Visit PRN    Prior Therapy none      Posture/Postural Control   Posture Comments improving posture and alignment with less head forward; shoulders rounded and elevated; head of the humerus anterior in orientation      Palpation   Spinal mobility hypomobile mid to upper thoracic and cervical spine with PA and lateral mobs    Palpation comment persistent but decreasing muscular tightness Rt > Lt ant/lat/posterior cervical musculature; pecs; upper traps; leveator; teres                           [facility] Adult PT  Treatment/Exercise - 03/26/21 0001       Neck Exercises: Seated   Neck Retraction 5 reps;5 secs    Cervical Rotation Right;Left;5 reps    Lateral Flexion Right;Left;5 reps    Shoulder Rolls Backwards;5 reps      Shoulder Exercises: Standing   External Rotation Strengthening;Right;Left;10 reps;Theraband    Theraband Level (Shoulder External Rotation) Level 3 (Green)    Extension Strengthening;Both;20 reps;Theraband    Theraband Level (Shoulder Extension) Level 3 (Green)    Extension Limitations lat pull blue TB x 10 reps    Row Strengthening;Both;20 reps;Theraband    Theraband Level (Shoulder Row) Level 3 (Green)    Row Limitations bow and arrow x 20 each side green TB    Retraction Strengthening;Both;10 reps;Theraband    Theraband Level (Shoulder Retraction) Level 2 (Red)    Shoulder Elevation Limitations antirotation green TB x 6 reps each side    Other Standing Exercises serratus activation red TB x 15 reps; shoulder clock x 10 each side      Shoulder Exercises: Stretch   Other Shoulder Stretches doorway 3 positions 20 sec hold x 2 reps each position; overdoor shouldre  flexion stretch 30 sec x 2 reps holding dowel - added slight rotation with doorway stretch to increase stretch through the pecs      Moist Heat Therapy   Number Minutes Moist Heat 10 Minutes    Moist Heat Location Cervical;Shoulder      Electrical Stimulation   Electrical Stimulation Location pt's TENs unit - no charge      Manual Therapy   Manual therapy comments skilled palpation to assess response to DN and manual work    Joint Mobilization cervical PA and lateral mobs    Soft tissue mobilization deep tissue work through the pecs and anterior/lateral/posterior cervical musculature    Passive ROM lateral cervical flexion; cervical flexion 10-15 sec hold x 2 each position    Manual Traction cervical traction 10-15 sec throughout treatment 3-4 reps 10-15 sec hold              Trigger Point Dry Needling -  03/26/21 0001     Consent Given? Yes    Education Handout Provided Previously provided    Other Dry Needling bilat    Scalenes Response Palpable increased muscle length                   PT Education - 03/26/21 0955     Education Details HEP    Person(s) Educated Patient    Methods Explanation;Demonstration;Tactile cues;Verbal cues;Handout    Comprehension Verbalized understanding;Returned demonstration;Verbal cues required;Tactile cues required                 PT Long Term Goals - 03/09/21 1010       PT LONG TERM GOAL #1   Title Improve posture and alignment with patient to demonstrate improved upright posture with posterior shoulder girdle engaged (middle trap and lower trap strength 4+/5 to 5/5)    Time 6    Period Weeks    Status On-going    Target Date 04/20/21      PT LONG TERM GOAL #2   Title Decrease frequency, intensity and duration of radicular symptoms to allow patient to use UE's for all functional activities    Time 6    Period Weeks    Status On-going    Target Date 04/20/21      PT LONG TERM GOAL #3   Title Increase cervical ROM and mobilty to WFL's in all planes without pain/tightness    Time 6    Period Weeks    Status On-going    Target Date 04/20/21      PT LONG TERM GOAL #4   Title Independent in HEP with patient to demonstrate and/or verbalize proper posture for rest and functional activities    Time 6    Period Weeks    Status On-going    Target Date 04/20/21      PT LONG TERM GOAL #5   Title Improve functional assessment score to 70    Time 6    Period Weeks    Status On-going    Target Date 04/20/21                   Plan - 03/26/21 0943     Clinical Impression Statement Recurrent numbness in the Rt UE on an intermittent basis related to forward activities. Patient is working on posture and alignment and continuing with HEP. She has persistent tightness in the scaleni and deep cervical musculature. Will  benefit from continued treatment.    Rehab Potential Good  PT Frequency 2x / week    PT Duration 6 weeks    PT Treatment/Interventions ADLs/Self Care Home Management;Aquatic Therapy;Cryotherapy;Electrical Stimulation;Iontophoresis 4mg /ml Dexamethasone;Moist Heat;Ultrasound;Therapeutic activities;Therapeutic exercise;Balance training;Neuromuscular re-education;Patient/family education;Manual techniques;Passive range of motion;Dry needling    PT Next Visit Plan review and progress HEP; neural stretch; continue manual work; continue dry needling as indicated; TENS/modalities as indicated    PT Home Exercise Plan [email or web address]    Consulted and Agree with Plan of Care Patient             Patient will benefit from skilled therapeutic intervention in order to improve the following deficits and impairments:     Visit Diagnosis: DDD (degenerative disc disease), cervical  Other symptoms and signs involving the musculoskeletal system  Abnormal posture  Muscle weakness (generalized)     Problem List Patient Active Problem List   Diagnosis Date Noted   Colon cancer screening 03/04/2021   Brittle nails 03/04/2021   DDD (degenerative disc disease), cervical 01/11/2021   Strain of abdominal muscle 02/07/2020   Nondisplaced fracture of the left third metatarsal base, comminuted fracture, nondisplaced of the left distal fibular shaft 02/04/2020    Clearnce Leja Rober MinionP Tinamarie Przybylski, PT, MPH  03/26/2021, 11:15 AM  [facility] [address] [place], [place], [address] Phone: [phone]   Fax:  [phone]  Name: Rhonda Wilkins MRN: [id number] Date of Birth: 11-Aug-1960

## 2021-04-01 ENCOUNTER — Ambulatory Visit
Payer: Managed Care, Other (non HMO) | Attending: Sports Medicine | Admitting: Rehabilitative and Restorative Service Providers"

## 2021-04-01 ENCOUNTER — Encounter: Payer: Self-pay | Admitting: Rehabilitative and Restorative Service Providers"

## 2021-04-01 ENCOUNTER — Other Ambulatory Visit: Payer: Self-pay

## 2021-04-01 DIAGNOSIS — M6281 Muscle weakness (generalized): Secondary | ICD-10-CM | POA: Insufficient documentation

## 2021-04-01 DIAGNOSIS — R293 Abnormal posture: Secondary | ICD-10-CM | POA: Insufficient documentation

## 2021-04-01 DIAGNOSIS — R29898 Other symptoms and signs involving the musculoskeletal system: Secondary | ICD-10-CM | POA: Diagnosis present

## 2021-04-01 DIAGNOSIS — M5030 Other cervical disc degeneration, unspecified cervical region: Secondary | ICD-10-CM | POA: Insufficient documentation

## 2021-04-01 NOTE — Therapy (Signed)
[facility] [place] [address] [place], [place], [address] Phone: [phone]   Fax:  [phone]  Physical Therapy Treatment  Patient Details  Name: Rhonda Wilkins MRN: [id number] Date of Birth: 04-17-1960 Referring Provider (PT): Dr [name]   Encounter Date: 04/01/2021   PT End of Session - 04/01/21 0932     Visit Number 15    Number of Visits 22    Date for PT Re-Evaluation 04/20/21    PT Start Time 0930    PT Stop Time 1018    PT Time Calculation (min) 48 min    Activity Tolerance Patient tolerated treatment well             Past Medical History:  Diagnosis Date   No pertinent past medical history     Past Surgical History:  Procedure Laterality Date   KNEE ARTHROSCOPY Left 1982    There were no vitals filed for this visit.   Subjective Assessment - 04/01/21 0934     Subjective No numbness or compression in arms since she was last year. She is working on the exercises at home. Still working on the posture and alignment.    Currently in Pain? No/denies    Pain Score 0-No pain    Pain Location Neck                [facility] PT Assessment - 04/01/21 0001       Assessment   Medical Diagnosis Cervical DDD; UE radicular symptoms    Referring Provider (PT) Dr [name]    Onset Date/Surgical Date 06/28/20    Hand Dominance Right    Next MD Visit PRN    Prior Therapy none      AROM   Overall AROM Comments shoulder AROM WFL's bilat      Strength   Overall Strength Comments 4+/5 strength middle lower trap/postural muscles      Palpation   Spinal mobility hypomobile mid to upper thoracic and cervical spine with PA and lateral mobs    Palpation comment persistent but decreasing muscular tightness Rt > Lt ant/lat/posterior cervical musculature; pecs; upper traps; leveator; teres                           [facility] Adult PT Treatment/Exercise - 04/01/21 0001       Neck  Exercises: Seated   Shoulder Rolls Backwards;5 reps      Shoulder Exercises: Standing   External Rotation Strengthening;Right;Left;10 reps;Theraband    Theraband Level (Shoulder External Rotation) Level 3 (Green)    Extension Limitations lat pull blue TB x 10 reps    Row Strengthening;Both;20 reps;Theraband    Theraband Level (Shoulder Row) Level 3 (Green)    Row Limitations bow and arrow x 20 each side green TB    Retraction Strengthening;Both;10 reps;Theraband    Theraband Level (Shoulder Retraction) Level 2 (Red)    Shoulder Elevation Limitations antirotation blue TB x 8 reps each side    Other Standing Exercises serratus activation red TB x 15 reps; shoulder clock x 10 each side      Shoulder Exercises: Stretch   Other Shoulder Stretches doorway 3 positions 20 sec hold x 2 reps each position; overdoor shouldre flexion stretch 30 sec x 2 reps holding dowel - added slight rotation with doorway stretch to increase stretch through the pecs      Moist Heat Therapy   Number Minutes Moist Heat 10 Minutes  Moist Heat Location Cervical;Shoulder      Electrical Stimulation   Electrical Stimulation Location pt's TENs unit - no charge      Manual Therapy   Manual therapy comments skilled palpation to assess response to DN and manual work    Joint Mobilization cervical PA and lateral mobs    Soft tissue mobilization deep tissue work through the pecs and anterior/lateral/posterior cervical musculature    Myofascial Release anterior chest    Passive ROM lateral cervical flexion; cervical flexion 10-15 sec hold x 2 each position    Manual Traction cervical traction 10-15 sec throughout treatment 3-4 reps 10-15 sec hold              Trigger Point Dry Needling - 04/01/21 0001     Consent Given? Yes    Education Handout Provided Previously provided    Other Dry Needling bilat    Upper Trapezius Response Palpable increased muscle length    Scalenes Response Palpable increased muscle  length                   PT Education - 04/01/21 0951     Education Details HEP    Person(s) Educated Patient    Methods Explanation;Demonstration;Tactile cues;Verbal cues;Handout    Comprehension Verbalized understanding;Returned demonstration;Verbal cues required;Tactile cues required                 PT Long Term Goals - 03/09/21 1010       PT LONG TERM GOAL #1   Title Improve posture and alignment with patient to demonstrate improved upright posture with posterior shoulder girdle engaged (middle trap and lower trap strength 4+/5 to 5/5)    Time 6    Period Weeks    Status On-going    Target Date 04/20/21      PT LONG TERM GOAL #2   Title Decrease frequency, intensity and duration of radicular symptoms to allow patient to use UE's for all functional activities    Time 6    Period Weeks    Status On-going    Target Date 04/20/21      PT LONG TERM GOAL #3   Title Increase cervical ROM and mobilty to WFL's in all planes without pain/tightness    Time 6    Period Weeks    Status On-going    Target Date 04/20/21      PT LONG TERM GOAL #4   Title Independent in HEP with patient to demonstrate and/or verbalize proper posture for rest and functional activities    Time 6    Period Weeks    Status On-going    Target Date 04/20/21      PT LONG TERM GOAL #5   Title Improve functional assessment score to 70    Time 6    Period Weeks    Status On-going    Target Date 04/20/21                   Plan - 04/01/21 [id number]     Clinical Impression Statement Patient is doing well with no symptoms in UE's in the past. Continued tightness Rt > Lt cervical musculature. Working on Counsellorpostural strengthening. Reviewed and corrected recent exercises.    Rehab Potential Good    PT Frequency 2x / week    PT Duration 6 weeks    PT Treatment/Interventions ADLs/Self Care Home Management;Aquatic Therapy;Cryotherapy;Electrical Stimulation;Iontophoresis 4mg /ml  Dexamethasone;Moist Heat;Ultrasound;Therapeutic activities;Therapeutic exercise;Balance training;Neuromuscular re-education;Patient/family education;Manual techniques;Passive range of motion;Dry needling  PT Next Visit Plan review and progress HEP; neural stretch; continue manual work; continue dry needling as indicated; TENS/modalities as indicated    PT Home Exercise Plan [email or web address]    Consulted and Agree with Plan of Care Patient             Patient will benefit from skilled therapeutic intervention in order to improve the following deficits and impairments:     Visit Diagnosis: DDD (degenerative disc disease), cervical  Other symptoms and signs involving the musculoskeletal system  Abnormal posture  Muscle weakness (generalized)     Problem List Patient Active Problem List   Diagnosis Date Noted   Colon cancer screening 03/04/2021   Brittle nails 03/04/2021   DDD (degenerative disc disease), cervical 01/11/2021   Strain of abdominal muscle 02/07/2020   Nondisplaced fracture of the left third metatarsal base, comminuted fracture, nondisplaced of the left distal fibular shaft 02/04/2020    Atavia Poppe Rober Minion, PT, MPH  04/01/2021, 10:17 AM  [facility] [address] [place], [place], [address] Phone: [phone]   Fax:  [phone]  Name: Selenia Mihok MRN: [id number] Date of Birth: June 11, 1960

## 2021-04-01 NOTE — Patient Instructions (Signed)
Access Code: [email or web address] URL: [email or web address] Date: 04/01/2021 Prepared by: Corlis Leak  Exercises Seated Cervical Retraction - 3 x daily - 7 x weekly - 1 sets - 10 reps Standing Scapular Retraction - 3 x daily - 7 x weekly - 1 sets - 10 reps - 10 hold Shoulder External Rotation and Scapular Retraction - 3 x daily - 7 x weekly - 1 sets - 10 reps - 3-5 sec hold Shoulder External Rotation in 45 Degrees Abduction - 2 x daily - 7 x weekly - 1-2 sets - 10 reps - 3 sec hold Doorway Pec Stretch at 60 Degrees Abduction - 3 x daily - 7 x weekly - 1 sets - 3 reps Doorway Pec Stretch at 90 Degrees Abduction - 3 x daily - 7 x weekly - 1 sets - 3 reps - 30 seconds hold Doorway Pec Stretch at 120 Degrees Abduction - 3 x daily - 7 x weekly - 1 sets - 3 reps - 30 second hold hold Shoulder External Rotation and Scapular Retraction with Resistance - 2 x daily - 7 x weekly - 3 sets - 10 reps - 3 hold Scapular Retraction with Resistance - 2 x daily - 7 x weekly - 3 sets - 10 reps - 3 hold Scapular Retraction with Resistance Advanced - 2 x daily - 7 x weekly - 3 sets - 10 reps - 3 hold Shoulder External Rotation with Resistance - 2 x daily - 7 x weekly - 1 sets - 10 reps - 3 sec hold Drawing Bow - 1 x daily - 7 x weekly - 1 sets - 10 reps - 3 sec hold Shoulder External Rotation with Anchored Resistance with Towel Under Elbow - 1 x daily - 7 x weekly - 1-2 sets - 10 reps - 3 sec hold Standing Shoulder Flexion Stretch on Wall - 2 x daily - 7 x weekly - 2 sets - 10 reps - 3 sec hold Shoulder Flexion Serratus Activation with Resistance - 1 x daily - 7 x weekly - 1 sets - 10 reps - 3-5 sec hold Wall Clock with Theraband - 1 x daily - 7 x weekly - 1 sets - 10 reps - 2-3 sec hold Standing Lat Pull Down with Resistance - Elbows Bent - 2 x daily - 7 x weekly - 1 sets - 10 reps - 3 sec hold Anti-Rotation Lateral Stepping with Press - 2 x daily - 7 x weekly - 1-2 sets - 10 reps - 2-3 sec hold

## 2021-04-05 ENCOUNTER — Encounter: Payer: Managed Care, Other (non HMO) | Admitting: Obstetrics & Gynecology

## 2021-04-09 ENCOUNTER — Encounter: Payer: Managed Care, Other (non HMO) | Admitting: Rehabilitative and Restorative Service Providers"

## 2021-04-12 ENCOUNTER — Encounter: Payer: Managed Care, Other (non HMO) | Admitting: Family Medicine

## 2021-04-15 ENCOUNTER — Other Ambulatory Visit: Payer: Self-pay

## 2021-04-15 ENCOUNTER — Encounter: Payer: Self-pay | Admitting: Rehabilitative and Restorative Service Providers"

## 2021-04-15 ENCOUNTER — Ambulatory Visit: Payer: Managed Care, Other (non HMO) | Admitting: Rehabilitative and Restorative Service Providers"

## 2021-04-15 DIAGNOSIS — M5030 Other cervical disc degeneration, unspecified cervical region: Secondary | ICD-10-CM

## 2021-04-15 DIAGNOSIS — R29898 Other symptoms and signs involving the musculoskeletal system: Secondary | ICD-10-CM

## 2021-04-15 DIAGNOSIS — R293 Abnormal posture: Secondary | ICD-10-CM

## 2021-04-15 DIAGNOSIS — M6281 Muscle weakness (generalized): Secondary | ICD-10-CM

## 2021-04-15 NOTE — Therapy (Addendum)
[facility] [place] [address] [place], [place], [address] Phone: [phone]   Fax:  [phone]  Physical Therapy Treatment and Discharge Summary  PHYSICAL THERAPY DISCHARGE SUMMARY  Visits from Start of Care: 16  Current functional level related to goals / functional outcomes: See progress note for discharge status    Remaining deficits: No known deficits    Education / Equipment: HEP    Patient agrees to discharge. Patient goals were met. Patient is being discharged due to meeting the stated rehab goals.  Rhonda Wilkins PT, MPH 05/25/21 12:37 PM  Patient Details  Name: Rhonda Wilkins MRN: [id number] Date of Birth: 1960/12/25 Referring Provider (PT): Dr [name]   Encounter Date: 04/15/2021   PT End of Session - 04/15/21 0803     Visit Number 16    Number of Visits 22    Date for PT Re-Evaluation 04/20/21    PT Start Time 0800    PT Stop Time 0848    PT Time Calculation (min) 48 min    Activity Tolerance Patient tolerated treatment well             Past Medical History:  Diagnosis Date   No pertinent past medical history     Past Surgical History:  Procedure Laterality Date   KNEE ARTHROSCOPY Left 1982    There were no vitals filed for this visit.   Subjective Assessment - 04/15/21 0803     Subjective Has had a sinus infection for several days. She has done some of her exercises but not as often as she has been. She has not had the compression in the arm but occasional tingling in her hands.    Currently in Pain? No/denies    Pain Score 0-No pain                [facility] PT Assessment - 04/15/21 0001       Assessment   Medical Diagnosis Cervical DDD; UE radicular symptoms    Referring Provider (PT) Dr [name]    Onset Date/Surgical Date 06/28/20    Hand Dominance Right    Next MD Visit PRN    Prior Therapy none      Observation/Other Assessments   Focus on Therapeutic  Outcomes (FOTO)  74      AROM   Right Shoulder Extension 65 Degrees    Right Shoulder Flexion 142 Degrees    Right Shoulder ABduction 161 Degrees    Right Shoulder Internal Rotation --   thumb T6   Right Shoulder External Rotation 71 Degrees    Left Shoulder Extension 51 Degrees    Left Shoulder Flexion 139 Degrees    Left Shoulder ABduction 162 Degrees    Left Shoulder Internal Rotation --   thumb T5   Left Shoulder External Rotation 57 Degrees    Cervical Flexion 67    Cervical Extension 63    Cervical - Right Side Bend 37 pulling    Cervical - Left Side Bend 42    Cervical - Right Rotation 76    Cervical - Left Rotation 71      Strength   Overall Strength Comments 5/5 strength middle lower trap/postural muscles      Palpation   Spinal mobility improved mobility mid to upper thoracic and cervical spine with PA and lateral mobs    Palpation comment minimal muscular tightness Rt > Lt ant/lat/posterior cervical musculature; pecs; upper traps; leveator; teres  [facility] Adult PT Treatment/Exercise - 04/15/21 0001       Neck Exercises: Seated   Neck Retraction 5 reps;5 secs    Cervical Rotation Right;Left;5 reps    Lateral Flexion Right;Left;5 reps    Shoulder Rolls Backwards;5 reps      Shoulder Exercises: Standing   External Rotation Strengthening;Right;Left;10 reps;Theraband    Theraband Level (Shoulder External Rotation) Level 4 (Blue)    Extension Strengthening;Both;20 reps;Theraband    Theraband Level (Shoulder Extension) Level 4 (Blue)    Row Strengthening;Both;20 reps;Theraband    Theraband Level (Shoulder Row) Level 4 (Blue)    Row Limitations bow and arrow x 20 each side green TB    Retraction Strengthening;Both;10 reps;Theraband    Theraband Level (Shoulder Retraction) Level 2 (Red)    Shoulder Elevation Limitations antirotation blue TB x 8 reps each side    Other Standing Exercises serratus activation red TB x 15 reps;  shoulder clock x 10 each side      Shoulder Exercises: Stretch   Other Shoulder Stretches doorway 3 positions 20 sec hold x 2 reps each position; overdoor shouldre flexion stretch 30 sec x 2 reps holding dowel - added slight rotation with doorway stretch to increase stretch through the pecs      Moist Heat Therapy   Number Minutes Moist Heat 10 Minutes    Moist Heat Location Cervical;Shoulder      Electrical Stimulation   Electrical Stimulation Location pt's TENs unit - no charge      Manual Therapy   Joint Mobilization cervical PA and lateral mobs    Soft tissue mobilization deep tissue work through the pecs and anterior/lateral/posterior cervical musculature    Myofascial Release anterior chest    Passive ROM lateral cervical flexion; cervical flexion 10-15 sec hold x 2 each position    Manual Traction cervical traction 10-15 sec throughout treatment 3-4 reps 10-15 sec hold                          PT Long Term Goals - 03/09/21 1010       PT LONG TERM GOAL #1   Title Improve posture and alignment with patient to demonstrate improved upright posture with posterior shoulder girdle engaged (middle trap and lower trap strength 4+/5 to 5/5)    Time 6    Period Weeks    Status On-going    Target Date 04/20/21      PT LONG TERM GOAL #2   Title Decrease frequency, intensity and duration of radicular symptoms to allow patient to use UE's for all functional activities    Time 6    Period Weeks    Status On-going    Target Date 04/20/21      PT LONG TERM GOAL #3   Title Increase cervical ROM and mobilty to WFL's in all planes without pain/tightness    Time 6    Period Weeks    Status On-going    Target Date 04/20/21      PT LONG TERM GOAL #4   Title Independent in HEP with patient to demonstrate and/or verbalize proper posture for rest and functional activities    Time 6    Period Weeks    Status On-going    Target Date 04/20/21      PT LONG TERM GOAL #5    Title Improve functional assessment score to 70    Time 6    Period Weeks    Status  On-going    Target Date 04/20/21                   Plan - 04/15/21 0805     Clinical Impression Statement Excellent progress since beginning therapy. Cervcial ROM is WFL's; compression and radicular symptoms are significantly improved with no compression and only occasional tingling in hands. Patient is independent in HEP. Goals of therapy have been accomplished. Patient will continue with independent HEP and call with any questinos or problems.    Rehab Potential Good    PT Frequency 2x / week    PT Duration 6 weeks    PT Treatment/Interventions ADLs/Self Care Home Management;Aquatic Therapy;Cryotherapy;Electrical Stimulation;Iontophoresis 4mg /ml Dexamethasone;Moist Heat;Ultrasound;Therapeutic activities;Therapeutic exercise;Balance training;Neuromuscular re-education;Patient/family education;Manual techniques;Passive range of motion;Dry needling    PT Next Visit Plan patient will continue with independent HEP and call with any questions or concerns    PT Home Exercise Plan [id number]    Consulted and Agree with Plan of Care Patient             Patient will benefit from skilled therapeutic intervention in order to improve the following deficits and impairments:     Visit Diagnosis: DDD (degenerative disc disease), cervical  Other symptoms and signs involving the musculoskeletal system  Abnormal posture  Muscle weakness (generalized)     Problem List Patient Active Problem List   Diagnosis Date Noted   Colon cancer screening 03/04/2021   Brittle nails 03/04/2021   DDD (degenerative disc disease), cervical 01/11/2021   Strain of abdominal muscle 02/07/2020   Nondisplaced fracture of the left third metatarsal base, comminuted fracture, nondisplaced of the left distal fibular shaft 02/04/2020    Rhonda Wilkins Rhonda Wilkins, PT, MPH  04/15/2021, 8:45 AM  [facility] [address] [place] [address] [place], [place], [address] Phone: [phone]   Fax:  [phone]  Name: Rhonda Wilkins MRN: [id number] Date of Birth: 22-Feb-1961

## 2021-04-22 ENCOUNTER — Other Ambulatory Visit: Payer: Self-pay

## 2021-04-22 ENCOUNTER — Ambulatory Visit (INDEPENDENT_AMBULATORY_CARE_PROVIDER_SITE_OTHER): Payer: Managed Care, Other (non HMO) | Admitting: Family Medicine

## 2021-04-22 ENCOUNTER — Encounter: Payer: Self-pay | Admitting: Family Medicine

## 2021-04-22 VITALS — BP 130/78 | HR 98 | Temp 98.1°F | Ht 62.0 in | Wt 154.0 lb

## 2021-04-22 DIAGNOSIS — Z0000 Encounter for general adult medical examination without abnormal findings: Secondary | ICD-10-CM

## 2021-04-22 DIAGNOSIS — Z1211 Encounter for screening for malignant neoplasm of colon: Secondary | ICD-10-CM | POA: Diagnosis not present

## 2021-04-22 NOTE — Progress Notes (Signed)
Rhonda Wilkins - 61 y.o. female MRN [id number]  Date of birth: [birth year]  Subjective Chief Complaint  Patient presents with   Annual Exam    HPI Rhonda Wilkins is a 61 year old female here today for annual exam.  Doing well at this time.  She has been referred to GYN for updated Pap.  She did have to cancel her recent appointment due to illness.  She does plan to reschedule.  She needs new referral to GI as previous referral has been closed she did not respond to scheduling calls.  She had mammogram completed which was normal.  She had labs completed at previous visit.  Cholesterol noted to be elevated.  Vitamin D is low as well as borderline low B12 levels.  She has a non-smoker.  She does not consume alcohol.  Review of Systems  Constitutional:  Negative for chills, fever, malaise/fatigue and weight loss.  HENT:  Negative for congestion, ear pain and sore throat.   Eyes:  Negative for blurred vision, double vision and pain.  Respiratory:  Negative for cough and shortness of breath.   Cardiovascular:  Negative for chest pain and palpitations.  Gastrointestinal:  Negative for abdominal pain, blood in stool, constipation, heartburn and nausea.  Genitourinary:  Negative for dysuria and urgency.  Musculoskeletal:  Negative for joint pain and myalgias.  Neurological:  Negative for dizziness and headaches.  Endo/Heme/Allergies:  Does not bruise/bleed easily.  Psychiatric/Behavioral:  Negative for depression. The patient is not nervous/anxious and does not have insomnia.      Allergies  Allergen Reactions   Latex Itching and Rash    Past Medical History:  Diagnosis Date   No pertinent past medical history     Past Surgical History:  Procedure Laterality Date   KNEE ARTHROSCOPY Left 1982    Social History   Socioeconomic History   Marital status: Married    Spouse name: Not on file   Number of children: Not on file   Years of education: Not on file   Highest education level:  Not on file  Occupational History   Not on file  Tobacco Use   Smoking status: Never   Smokeless tobacco: Never  Vaping Use   Vaping Use: Never used  Substance and Sexual Activity   Alcohol use: Not Currently   Drug use: Not on file   Sexual activity: Not on file  Other Topics Concern   Not on file  Social History Narrative   Not on file   Social Determinants of Health   Financial Resource Strain: Not on file  Food Insecurity: Not on file  Transportation Needs: Not on file  Physical Activity: Not on file  Stress: Not on file  Social Connections: Not on file    Family History  Problem Relation Age of Onset   Cancer Mother    Healthy Father     Health Maintenance  Topic Date Due   HIV Screening  Never done   Hepatitis C Screening  Never done   PAP SMEAR-Modifier  Never done   COLONOSCOPY (Pts 45-60yrs Insurance coverage will need to be confirmed)  Never done   Zoster Vaccines- Shingrix (1 of 2) 05/20/2021 (Originally 12/16/2012)   Gurley  05/28/2021 (Originally 09/28/2020)   MAMMOGRAM  03/18/2023   TETANUS/TDAP  02/07/2031   HPV VACCINES  Aged Out     ----------------------------------------------------------------------------------------------------------------------------------------------------------------------------------------------------------------- Physical Exam BP 130/78    Pulse 98    Temp 98.1 F (36.7 C)    Ht 5\' 2"  (  1.575 m)    Wt 154 lb (69.9 kg)    SpO2 98%    BMI 28.17 kg/m   Physical Exam Constitutional:      General: She is not in acute distress. HENT:     Head: Normocephalic and atraumatic.     Right Ear: Tympanic membrane and ear canal normal.     Left Ear: Tympanic membrane and ear canal normal.     Nose: Nose normal.  Eyes:     General: No scleral icterus.    Conjunctiva/sclera: Conjunctivae normal.  Neck:     Thyroid: No thyromegaly.  Cardiovascular:     Rate and Rhythm: Normal rate and regular rhythm.     Heart  sounds: Normal heart sounds.  Pulmonary:     Effort: Pulmonary effort is normal.     Breath sounds: Normal breath sounds.  Abdominal:     General: Bowel sounds are normal. There is no distension.     Palpations: Abdomen is soft.     Tenderness: There is no abdominal tenderness. There is no guarding.  Musculoskeletal:        General: Normal range of motion.     Cervical back: Normal range of motion and neck supple.  Lymphadenopathy:     Cervical: No cervical adenopathy.  Skin:    General: Skin is warm and dry.     Findings: No rash.  Neurological:     General: No focal deficit present.     Mental Status: She is alert and oriented to person, place, and time.     Cranial Nerves: No cranial nerve deficit.     Coordination: Coordination normal.  Psychiatric:        Mood and Affect: Mood normal.        Behavior: Behavior normal.    ------------------------------------------------------------------------------------------------------------------------------------------------------------------------------------------------------------------- Assessment and Plan  Well adult exam Well adult Orders Placed This Encounter  Procedures   Ambulatory referral to Gastroenterology    Referral Priority:   Routine    Referral Type:   Consultation    Referral Reason:   Specialty Services Required    Number of Visits Requested:   1  Screenings: Referral placed for colon cancer screening.  She will plan to reschedule her appointment with GYN. Immunizations: Declines shingles and flu vaccine. Anticipatory guidance/risk factor reduction: Recommend adding 5000 IUs of vitamin D as well as 1000 mcg of B12 daily.  Additional recommendations per AVS.   No orders of the defined types were placed in this encounter.   Return in about 6 months (around 10/20/2021) for Lipids f/u.    This visit occurred during the SARS-CoV-2 public health emergency.  Safety protocols were in place, including screening  questions prior to the visit, additional usage of staff PPE, and extensive cleaning of exam room while observing appropriate contact time as indicated for disinfecting solutions.

## 2021-04-22 NOTE — Assessment & Plan Note (Addendum)
Well adult Orders Placed This Encounter  Procedures   Ambulatory referral to Gastroenterology    Referral Priority:   Routine    Referral Type:   Consultation    Referral Reason:   Specialty Services Required    Number of Visits Requested:   1  Screenings: Referral placed for colon cancer screening.  She will plan to reschedule her appointment with GYN. Immunizations: Declines shingles and flu vaccine. Anticipatory guidance/risk factor reduction: Recommend adding 5000 IUs of vitamin D as well as 1000 mcg of B12 daily.  Additional recommendations per AVS.

## 2021-04-22 NOTE — Patient Instructions (Addendum)
Add vitamin d 5000 IU daily.  Add B12 1047mg daily.   Follow up in 6 months to recheck cholesterol.     Preventive Care [id number] Old, Female Preventive care refers to lifestyle choices and visits with your health care provider that can promote health and wellness. Preventive care visits are also called wellness exams. What can I expect for my preventive care visit? Counseling Your health care provider may ask you questions about your: Medical history, including: Past medical problems. Family medical history. Pregnancy history. Current health, including: Menstrual cycle. Method of birth control. Emotional well-being. Home life and relationship well-being. Sexual activity and sexual health. Lifestyle, including: Alcohol, nicotine or tobacco, and drug use. Access to firearms. Diet, exercise, and sleep habits. Work and work [employer] Sunscreen use. Safety issues such as seatbelt and bike helmet use. Physical exam Your health care provider will check your: Height and weight. These may be used to calculate your BMI (body mass index). BMI is a measurement that tells if you are at a healthy weight. Waist circumference. This measures the distance around your waistline. This measurement also tells if you are at a healthy weight and may help predict your risk of certain diseases, such as type 2 diabetes and high blood pressure. Heart rate and blood pressure. Body temperature. Skin for abnormal spots. What immunizations do I need? Vaccines are usually given at various ages, according to a schedule. Your health care provider will recommend vaccines for you based on your age, medical history, and lifestyle or other factors, such as travel or where you work. What tests do I need? Screening Your health care provider may recommend screening tests for certain conditions. This may include: Lipid and cholesterol levels. Diabetes screening. This is done by checking your blood sugar  (glucose) after you have not eaten for a while (fasting). Pelvic exam and Pap test. Hepatitis B test. Hepatitis C test. HIV (human immunodeficiency virus) test. STI (sexually transmitted infection) testing, if you are at risk. Lung cancer screening. Colorectal cancer screening. Mammogram. Talk with your health care provider about when you should start having regular mammograms. This may depend on whether you have a family history of breast cancer. BRCA-related cancer screening. This may be done if you have a family history of breast, ovarian, tubal, or peritoneal cancers. Bone density scan. This is done to screen for osteoporosis. Talk with your health care provider about your test results, treatment options, and if necessary, the need for more tests. Follow these instructions at home: Eating and drinking  Eat a diet that includes fresh fruits and vegetables, whole grains, lean protein, and low-fat dairy products. Take vitamin and mineral supplements as recommended by your health care provider. Do not drink alcohol if: Your health care provider tells you not to drink. You are pregnant, may be pregnant, or are planning to become pregnant. If you drink alcohol: Limit how much you have to 0-1 drink a day. Know how much alcohol is in your drink. In the [place] one drink equals one 12 oz bottle of beer (355 mL), one 5 oz glass of wine (148 mL), or one 1 oz glass of hard liquor (44 mL). Lifestyle Brush your teeth every morning and night with fluoride toothpaste. Floss one time each day. Exercise for at least 30 minutes 5 or more days each week. Do not use any products that contain nicotine or tobacco. These products include cigarettes, chewing tobacco, and vaping devices, such as e-cigarettes. If you need help quitting, ask your  health care provider. Do not use drugs. If you are sexually active, practice safe sex. Use a condom or other form of protection to prevent STIs. If you do not wish to  become pregnant, use a form of birth control. If you plan to become pregnant, see your health care provider for a prepregnancy visit. Take aspirin only as told by your health care provider. Make sure that you understand how much to take and what form to take. Work with your health care provider to find out whether it is safe and beneficial for you to take aspirin daily. Find healthy ways to manage stress, such as: Meditation, yoga, or listening to music. Journaling. Talking to a trusted person. Spending time with friends and family. Minimize exposure to UV radiation to reduce your risk of skin cancer. Safety Always wear your seat belt while driving or riding in a vehicle. Do not drive: If you have been drinking alcohol. Do not ride with someone who has been drinking. When you are tired or distracted. While texting. If you have been using any mind-altering substances or drugs. Wear a helmet and other protective equipment during sports activities. If you have firearms in your house, make sure you follow all gun safety procedures. Seek help if you have been physically or sexually abused. What's next? Visit your health care provider once a year for an annual wellness visit. Ask your health care provider how often you should have your eyes and teeth checked. Stay up to date on all vaccines. This information is not intended to replace advice given to you by your health care provider. Make sure you discuss any questions you have with your health care provider. Document Revised: 08/12/2020 Document Reviewed: 08/12/2020 Elsevier Patient Education  [place] [facility].

## 2021-05-25 IMAGING — DX DG FOOT COMPLETE 3+V*L*
3 series · 3 of 3 positions shown · non-contrast
Comparison: 02/02/2020, 02/04/2020, 02/11/2020

CLINICAL DATA: 59-year-old female with third metatarsal fracture

EXAM:
LEFT FOOT - COMPLETE 3+ VIEW

[foot ap]
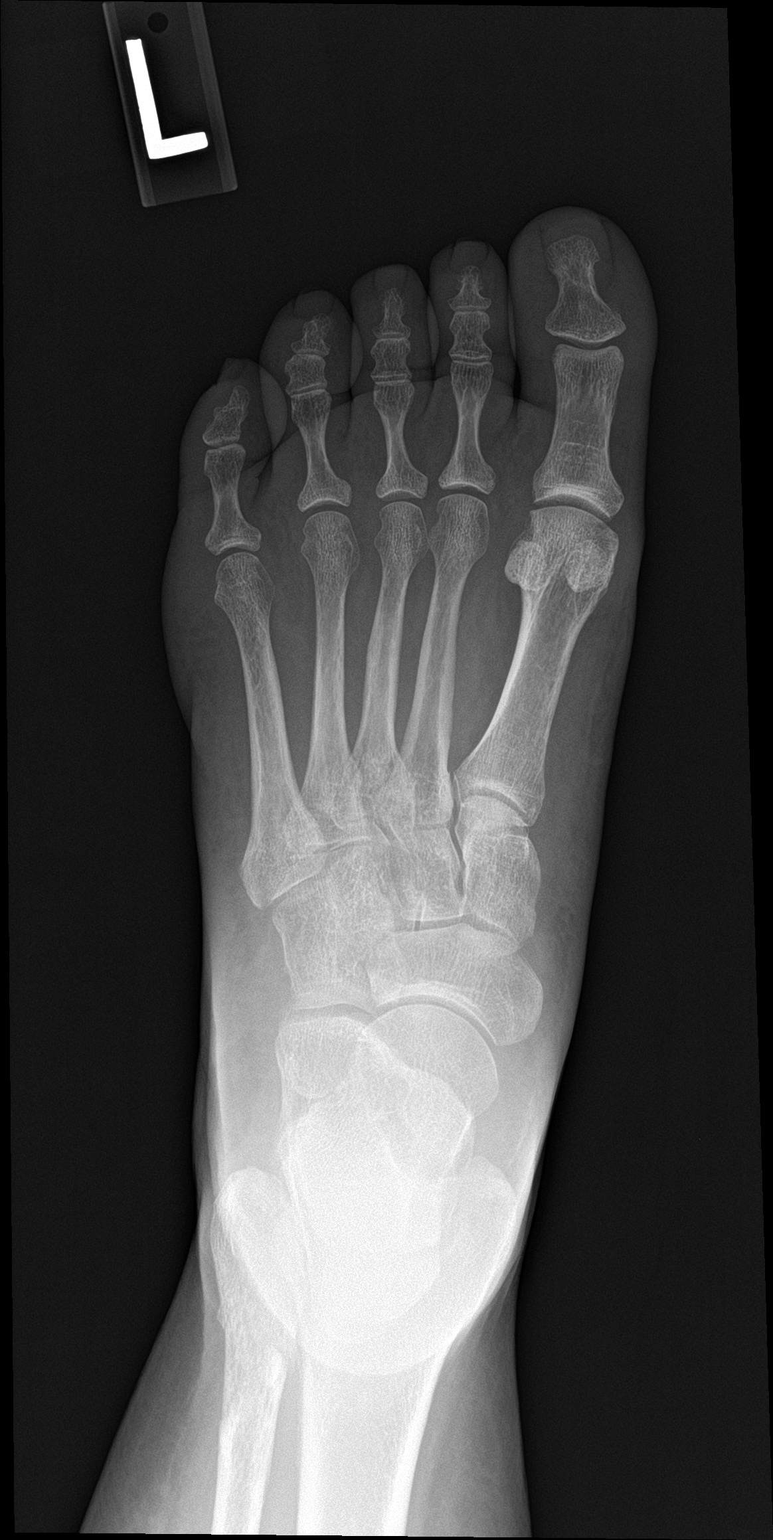

[foot obl]
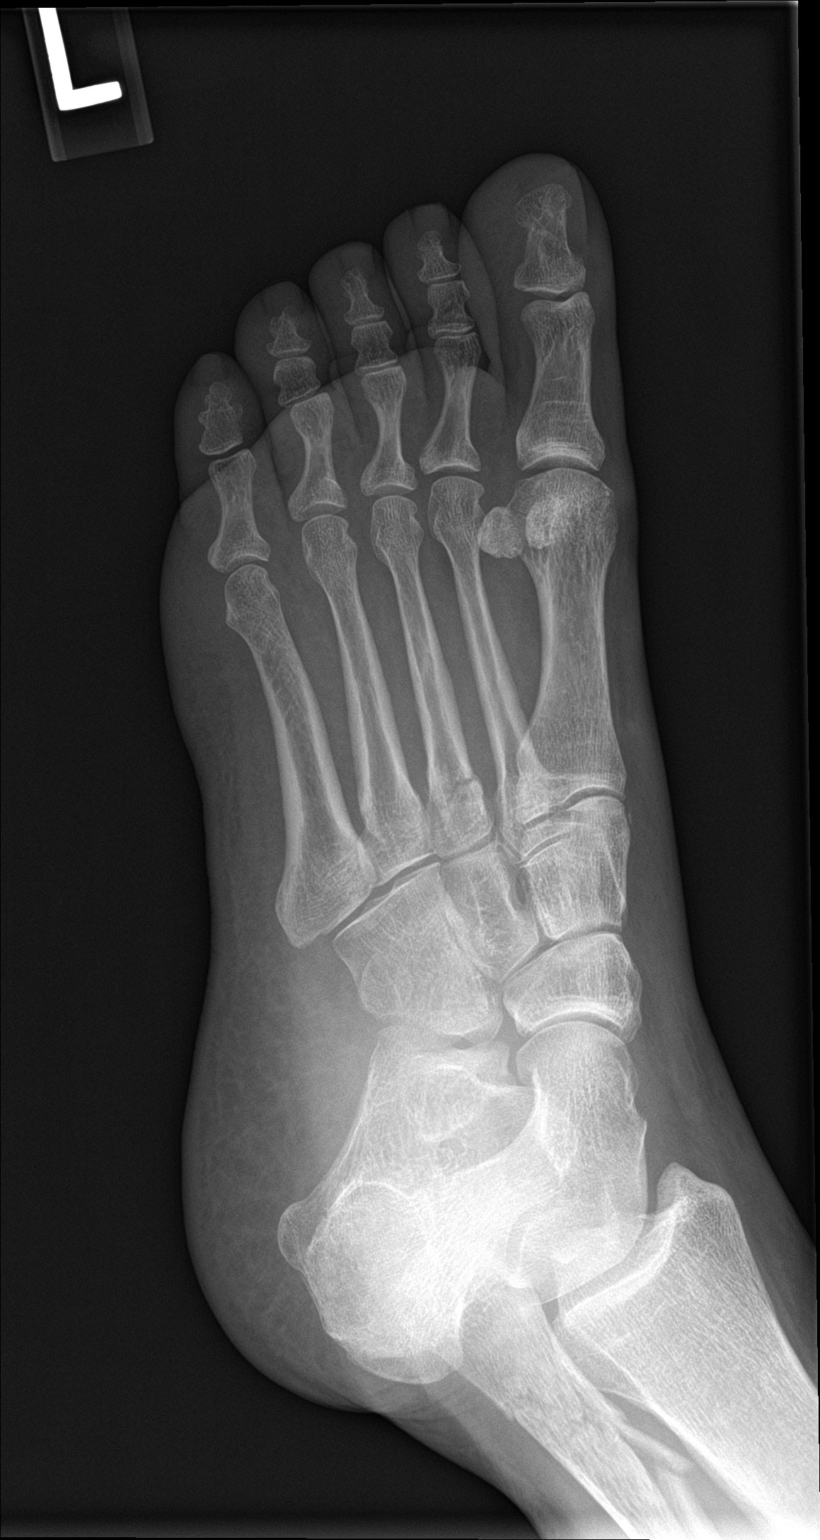

[foot lat]
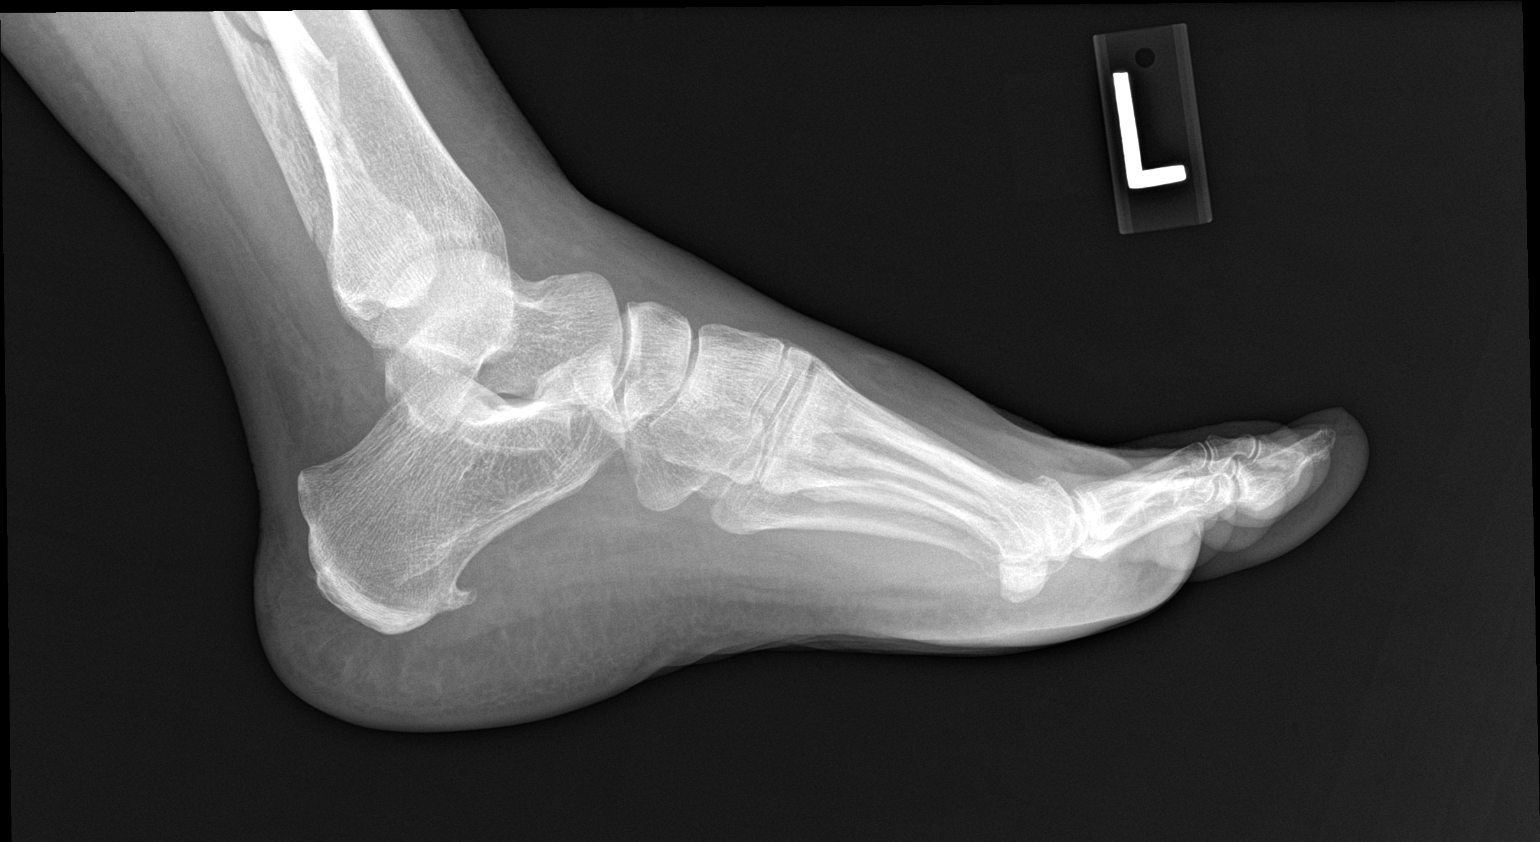

[3 of 3 positions shown; findings below may reference images not displayed]

FINDINGS: Redemonstration of fracture at the base of the third metatarsal. No
significant callus formation at this time. No change in alignment at
the fracture site.

There is no widening appreciated at the C1-M2 interval (Lisfranc).
No additional fracture.
IMPRESSION: Redemonstration of fracture at the base of the third metatarsal
without significant callus formation. These fractures have been
associated with Lisfranc joint injury. Although there is no plain
film sign of a Lisfranc injury, MR Zeneida be considered to evaluate for
nondisplaced injury/soft tissue injury if there are ongoing
symptoms.

## 2021-05-25 IMAGING — DX DG ANKLE COMPLETE 3+V*L*
3 series · 3 of 3 positions shown · non-contrast
Comparison: 02/11/2020, 02/02/2020

CLINICAL DATA: 59-year-old female with a prior fibular fracture

EXAM:
LEFT ANKLE COMPLETE - 3+ VIEW

[ankle ap]
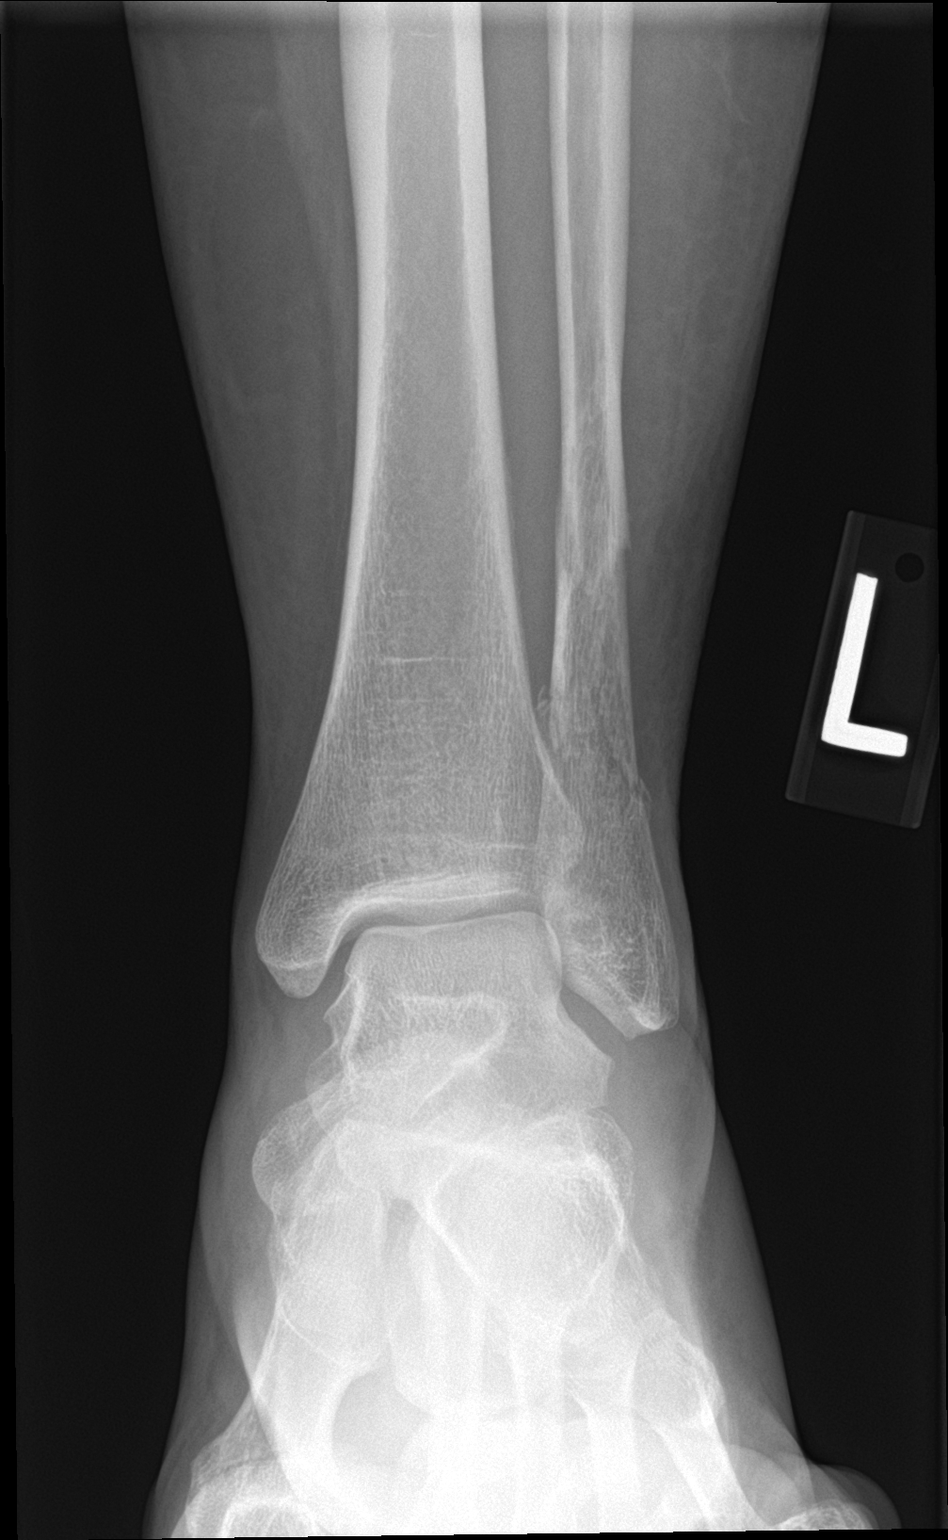

[ankle obl]
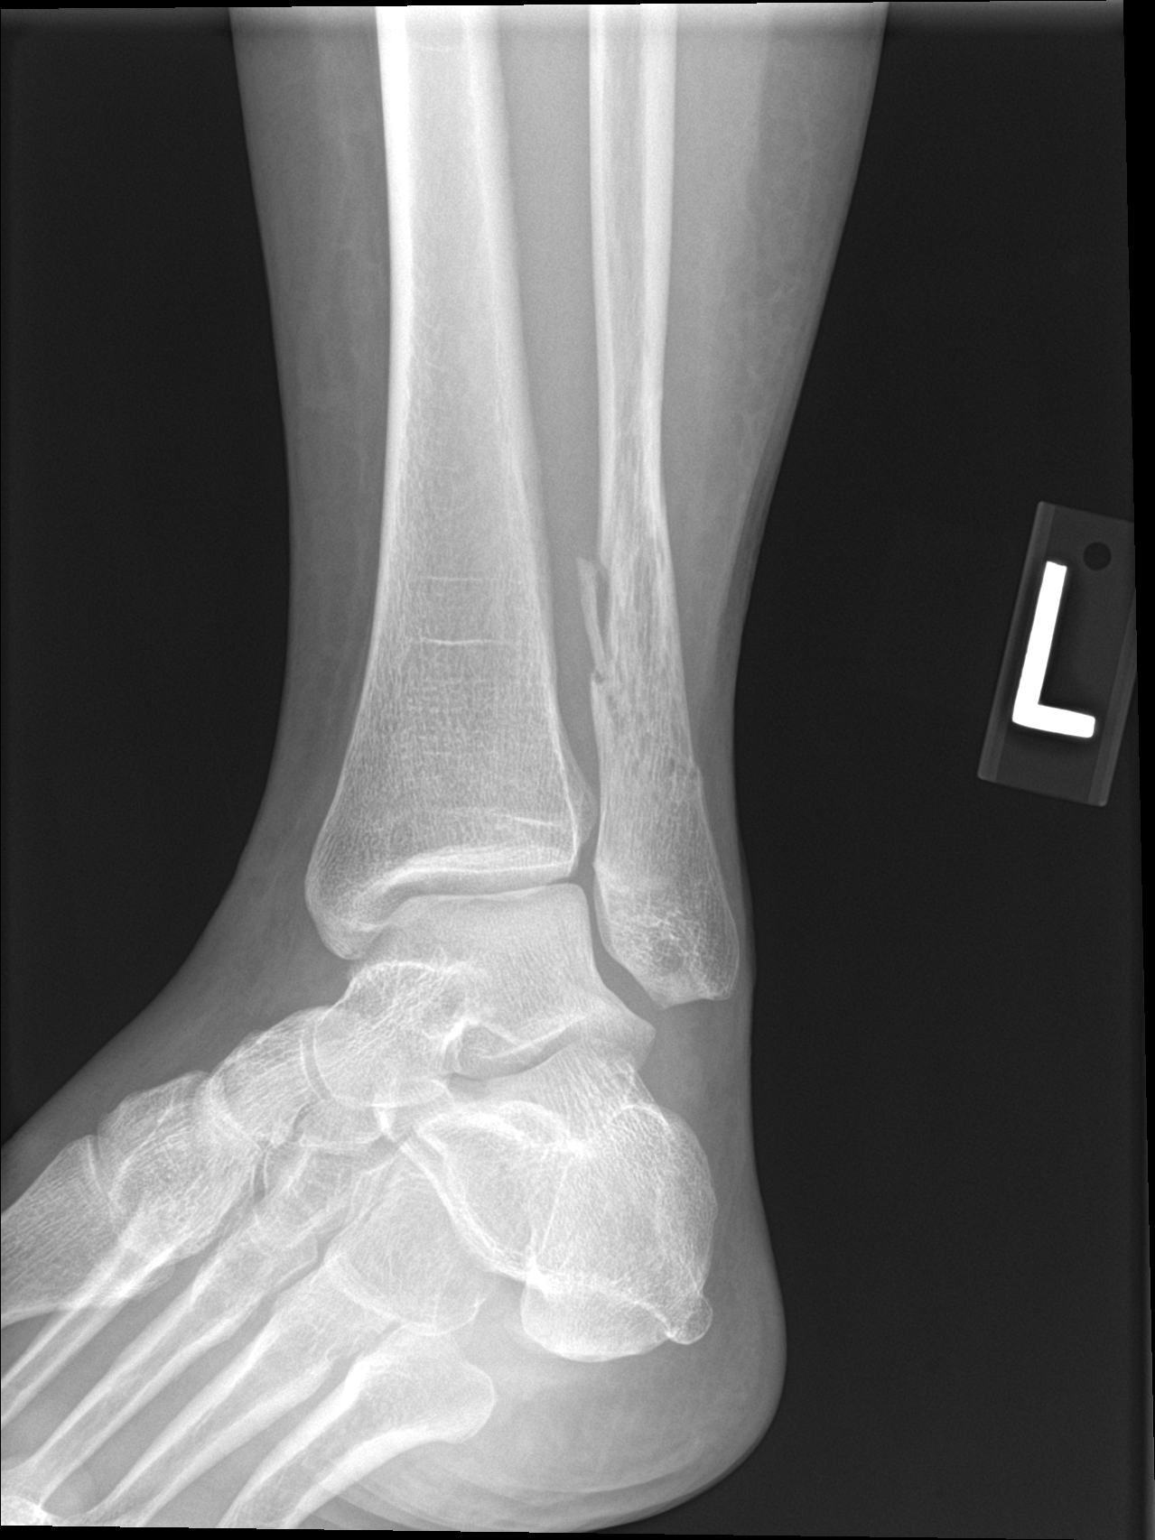

[ankle lat]
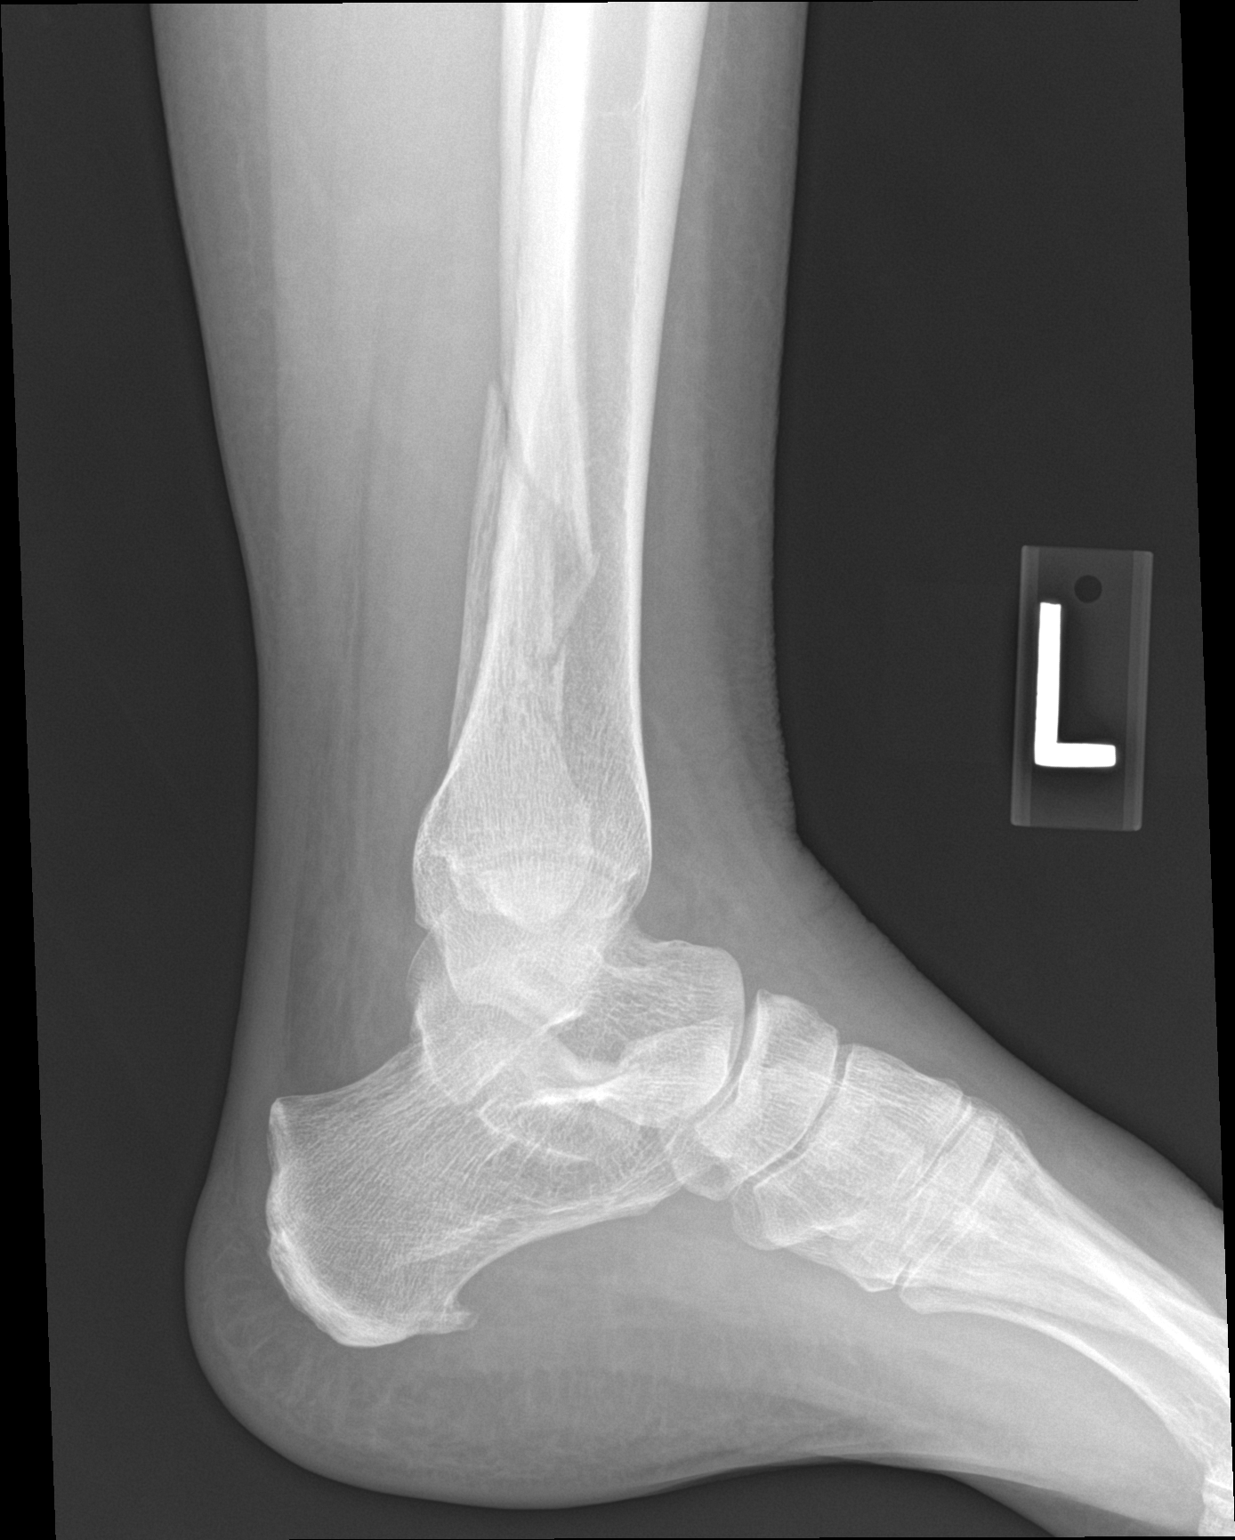

[3 of 3 positions shown; findings below may reference images not displayed]

FINDINGS: Redemonstration of segmental fracture of the distal fibula, above
the mortise. No significant callus formation. The alignment is
maintained from the baseline plain film of 02/02/2020. Mortise
appears congruent. No additional fracture site identified. No
radiopaque foreign body.
IMPRESSION: Redemonstration of segmental fracture of the distal left fibula
above the ankle mortise, not significantly changed in alignment, and
with no significant callus formation.

## 2021-10-20 ENCOUNTER — Encounter: Payer: Self-pay | Admitting: Family Medicine

## 2021-10-20 ENCOUNTER — Ambulatory Visit (INDEPENDENT_AMBULATORY_CARE_PROVIDER_SITE_OTHER): Payer: Managed Care, Other (non HMO) | Admitting: Family Medicine

## 2021-10-20 VITALS — BP 128/86 | HR 82 | Temp 97.6°F | Ht 62.0 in | Wt 144.1 lb

## 2021-10-20 DIAGNOSIS — E782 Mixed hyperlipidemia: Secondary | ICD-10-CM

## 2021-10-20 DIAGNOSIS — E559 Vitamin D deficiency, unspecified: Secondary | ICD-10-CM | POA: Insufficient documentation

## 2021-10-20 NOTE — Progress Notes (Signed)
[name] - 61 y.o. female MRN [id number]  Date of birth: 1960-12-01  Subjective Chief Complaint  Patient presents with   Follow-up    Rhonda Wilkins is a 61 year old female here today for follow-up visit.  Cholesterol elevated at previous appointment.  She wanted to work on dietary changes at that time rather than adding.  medication.  Since that time she really has not made any significant changes.  Reports having increased stress helping her uncle find a rehab facility and having to care for him.  She has not made appointments for GYN or colonoscopy due to being overwhelmed by her current schedule.  ROS:  A comprehensive ROS was completed and negative except as noted per HPI  Allergies  Allergen Reactions   Latex Itching and Rash    Past Medical History:  Diagnosis Date   No pertinent past medical history     Past Surgical History:  Procedure Laterality Date   KNEE ARTHROSCOPY Left 1982    Social History   Socioeconomic History   Marital status: Married    Spouse name: Not on file   Number of children: Not on file   Years of education: Not on file   Highest education level: Not on file  Occupational History   Not on file  Tobacco Use   Smoking status: Never   Smokeless tobacco: Never  Vaping Use   Vaping Use: Never used  Substance and Sexual Activity   Alcohol use: Not Currently   Drug use: Not on file   Sexual activity: Not on file  Other Topics Concern   Not on file  Social History Narrative   Not on file   Social Determinants of Health   Financial Resource Strain: Not on file  Food Insecurity: Not on file  Transportation Needs: Not on file  Physical Activity: Not on file  Stress: Not on file  Social Connections: Not on file    Family History  Problem Relation Age of Onset   Cancer Mother    Healthy Father     Health Maintenance  Topic Date Due   HIV Screening  Never done   Hepatitis C Screening  Never done   PAP SMEAR-Modifier  10/20/2021  (Originally 12/17/1983)   Zoster Vaccines- Shingrix (1 of 2) 01/20/2022 (Originally 12/16/2012)   INFLUENZA VACCINE  05/29/2022 (Originally 09/28/2021)   COLONOSCOPY (Pts 45-54yrs Insurance coverage will need to be confirmed)  10/21/2022 (Originally 12/17/2007)   MAMMOGRAM  03/18/2023   TETANUS/TDAP  02/07/2031   HPV VACCINES  Aged Out     ----------------------------------------------------------------------------------------------------------------------------------------------------------------------------------------------------------------- Physical Exam BP 128/86 (BP Location: Left Arm, Patient Position: Sitting, Cuff Size: Normal)   Pulse 82   Temp 97.6 F (36.4 C) (Oral)   Ht 5\' 2"  (1.575 m)   Wt 144 lb 1.3 oz (65.4 kg)   SpO2 100%   BMI 26.35 kg/m   Physical Exam Constitutional:      Appearance: Normal appearance.  Eyes:     General: No scleral icterus. Musculoskeletal:     Cervical back: Neck supple.  Neurological:     Mental Status: She is alert.  Psychiatric:        Mood and Affect: Mood normal.        Behavior: Behavior normal.     ------------------------------------------------------------------------------------------------------------------------------------------------------------------------------------------------------------------- Assessment and Plan  Vitamin D deficiency Update vitamin D levels.  Mixed hyperlipidemia Updating lipid panel.  Encouraged to work on dietary changes.   No orders of the defined types were placed in this encounter.  Return in about 6 months (around 04/22/2022) for Annual exam.    This visit occurred during the SARS-CoV-2 public health emergency.  Safety protocols were in place, including screening questions prior to the visit, additional usage of staff PPE, and extensive cleaning of exam room while observing appropriate contact time as indicated for disinfecting solutions.

## 2021-10-20 NOTE — Assessment & Plan Note (Signed)
Update vitamin D levels. 

## 2021-10-20 NOTE — Assessment & Plan Note (Signed)
Updating lipid panel.  Encouraged to work on dietary changes.

## 2021-10-21 LAB — COMPLETE METABOLIC PANEL WITH GFR
AG Ratio: 2.1 (calc) (ref 1.0–2.5)
ALT: 25 U/L (ref 6–29)
AST: 19 U/L (ref 10–35)
Albumin: 4.6 g/dL (ref 3.6–5.1)
Alkaline phosphatase (APISO): 67 U/L (ref 37–153)
BUN: 13 mg/dL (ref 7–25)
CO2: 26 mmol/L (ref 20–32)
Calcium: 9.7 mg/dL (ref 8.6–10.4)
Chloride: 105 mmol/L (ref 98–110)
Creat: 0.62 mg/dL (ref 0.50–1.03)
Globulin: 2.2 g/dL (calc) (ref 1.9–3.7)
Glucose, Bld: 99 mg/dL (ref 65–99)
Potassium: 5.2 mmol/L (ref 3.5–5.3)
Sodium: 141 mmol/L (ref 135–146)
Total Bilirubin: 0.6 mg/dL (ref 0.2–1.2)
Total Protein: 6.8 g/dL (ref 6.1–8.1)
eGFR: 103 mL/min/{1.73_m2} (ref >=60)

## 2021-10-21 LAB — LIPID PANEL W/REFLEX DIRECT LDL
Cholesterol: 203 mg/dL — ABNORMAL HIGH (ref <200)
HDL: 65 mg/dL (ref >=50)
LDL Cholesterol (Calc): 122 mg/dL (calc) — ABNORMAL HIGH
Non-HDL Cholesterol (Calc): 138 mg/dL (calc) — ABNORMAL HIGH (ref <130)
Total CHOL/HDL Ratio: 3.1 (calc) (ref <5.0)
Triglycerides: 66 mg/dL (ref <150)

## 2021-10-21 LAB — VITAMIN D 25 HYDROXY (VIT D DEFICIENCY, FRACTURES): Vit D, 25-Hydroxy: 33 ng/mL (ref 30–100)

## 2022-04-27 ENCOUNTER — Ambulatory Visit (INDEPENDENT_AMBULATORY_CARE_PROVIDER_SITE_OTHER): Payer: Managed Care, Other (non HMO) | Admitting: Family Medicine

## 2022-04-27 ENCOUNTER — Encounter: Payer: Self-pay | Admitting: Family Medicine

## 2022-04-27 VITALS — BP 127/80 | HR 99 | Ht 62.0 in | Wt 151.0 lb

## 2022-04-27 DIAGNOSIS — Z0000 Encounter for general adult medical examination without abnormal findings: Secondary | ICD-10-CM

## 2022-04-27 DIAGNOSIS — E782 Mixed hyperlipidemia: Secondary | ICD-10-CM | POA: Diagnosis not present

## 2022-04-27 DIAGNOSIS — E559 Vitamin D deficiency, unspecified: Secondary | ICD-10-CM

## 2022-04-27 DIAGNOSIS — R5383 Other fatigue: Secondary | ICD-10-CM

## 2022-04-27 DIAGNOSIS — Z1211 Encounter for screening for malignant neoplasm of colon: Secondary | ICD-10-CM

## 2022-04-27 DIAGNOSIS — Z13220 Encounter for screening for lipoid disorders: Secondary | ICD-10-CM

## 2022-04-27 NOTE — Assessment & Plan Note (Signed)
Checking vitamin d, tsh, b12 and ferritin levels.

## 2022-04-27 NOTE — Patient Instructions (Signed)
Preventive Care 40-60 Years Old, Female Preventive care refers to lifestyle choices and visits with your health care provider that can promote health and wellness. Preventive care visits are also called wellness exams. What can I expect for my preventive care visit? Counseling Your health care provider may ask you questions about your: Medical history, including: Past medical problems. Family medical history. Pregnancy history. Current health, including: Menstrual cycle. Method of birth control. Emotional well-being. Home life and relationship well-being. Sexual activity and sexual health. Lifestyle, including: Alcohol, nicotine or tobacco, and drug use. Access to firearms. Diet, exercise, and sleep habits. Work and work environment. Sunscreen use. Safety issues such as seatbelt and bike helmet use. Physical exam Your health care provider will check your: Height and weight. These may be used to calculate your BMI (body mass index). BMI is a measurement that tells if you are at a healthy weight. Waist circumference. This measures the distance around your waistline. This measurement also tells if you are at a healthy weight and may help predict your risk of certain diseases, such as type 2 diabetes and high blood pressure. Heart rate and blood pressure. Body temperature. Skin for abnormal spots. What immunizations do I need?  Vaccines are usually given at various ages, according to a schedule. Your health care provider will recommend vaccines for you based on your age, medical history, and lifestyle or other factors, such as travel or where you work. What tests do I need? Screening Your health care provider may recommend screening tests for certain conditions. This may include: Lipid and cholesterol levels. Diabetes screening. This is done by checking your blood sugar (glucose) after you have not eaten for a while (fasting). Pelvic exam and Pap test. Hepatitis B test. Hepatitis C  test. HIV (human immunodeficiency virus) test. STI (sexually transmitted infection) testing, if you are at risk. Lung cancer screening. Colorectal cancer screening. Mammogram. Talk with your health care provider about when you should start having regular mammograms. This may depend on whether you have a family history of breast cancer. BRCA-related cancer screening. This may be done if you have a family history of breast, ovarian, tubal, or peritoneal cancers. Bone density scan. This is done to screen for osteoporosis. Talk with your health care provider about your test results, treatment options, and if necessary, the need for more tests. Follow these instructions at home: Eating and drinking  Eat a diet that includes fresh fruits and vegetables, whole grains, lean protein, and low-fat dairy products. Take vitamin and mineral supplements as recommended by your health care provider. Do not drink alcohol if: Your health care provider tells you not to drink. You are pregnant, may be pregnant, or are planning to become pregnant. If you drink alcohol: Limit how much you have to 0-1 drink a day. Know how much alcohol is in your drink. In the [place] one drink equals one 12 oz bottle of beer (355 mL), one 5 oz glass of wine (148 mL), or one 1 oz glass of hard liquor (44 mL). Lifestyle Brush your teeth every morning and night with fluoride toothpaste. Floss one time each day. Exercise for at least 30 minutes 5 or more days each week. Do not use any products that contain nicotine or tobacco. These products include cigarettes, chewing tobacco, and vaping devices, such as e-cigarettes. If you need help quitting, ask your health care provider. Do not use drugs. If you are sexually active, practice safe sex. Use a condom or other form of protection to   prevent STIs. If you do not wish to become pregnant, use a form of birth control. If you plan to become pregnant, see your health care provider for a  prepregnancy visit. Take aspirin only as told by your health care provider. Make sure that you understand how much to take and what form to take. Work with your health care provider to find out whether it is safe and beneficial for you to take aspirin daily. Find healthy ways to manage stress, such as: Meditation, yoga, or listening to music. Journaling. Talking to a trusted person. Spending time with friends and family. Minimize exposure to UV radiation to reduce your risk of skin cancer. Safety Always wear your seat belt while driving or riding in a vehicle. Do not drive: If you have been drinking alcohol. Do not ride with someone who has been drinking. When you are tired or distracted. While texting. If you have been using any mind-altering substances or drugs. Wear a helmet and other protective equipment during sports activities. If you have firearms in your house, make sure you follow all gun safety procedures. Seek help if you have been physically or sexually abused. What's next? Visit your health care provider once a year for an annual wellness visit. Ask your health care provider how often you should have your eyes and teeth checked. Stay up to date on all vaccines. This information is not intended to replace advice given to you by your health care provider. Make sure you discuss any questions you have with your health care provider. Document Revised: 08/12/2020 Document Reviewed: 08/12/2020 Elsevier Patient Education  2023 [employer] Inc[employer]  

## 2022-04-27 NOTE — Progress Notes (Signed)
Rhonda Wilkins - 62 y.o. female MRN [id number]  Date of birth: 07-03-1960  Subjective Chief Complaint  Patient presents with   Annual Exam    HPI Rhonda Wilkins is a 62 y.o. female here today for annual exam.   She reports that she is doing pretty well, feels some fatigue.   Admits to some caregiver stress from caring for her paretns.   Planning on seeing GYN to have Pap smear updated.   She is moderately active, planning on incorporating walking more.  She feels that her diet is pretty good.   She declines recommended immunizations.    Due for colon cancer screening, she would like referral.    Review of Systems  Constitutional:  Negative for chills, fever, malaise/fatigue and weight loss.  HENT:  Negative for congestion, ear pain and sore throat.   Eyes:  Negative for blurred vision, double vision and pain.  Respiratory:  Negative for cough and shortness of breath.   Cardiovascular:  Negative for chest pain and palpitations.  Gastrointestinal:  Negative for abdominal pain, blood in stool, constipation, heartburn and nausea.  Genitourinary:  Negative for dysuria and urgency.  Musculoskeletal:  Negative for joint pain and myalgias.  Neurological:  Negative for dizziness and headaches.  Endo/Heme/Allergies:  Does not bruise/bleed easily.  Psychiatric/Behavioral:  Negative for depression. The patient is not nervous/anxious and does not have insomnia.      Allergies  Allergen Reactions   Latex Itching and Rash    Past Medical History:  Diagnosis Date   No pertinent past medical history     Past Surgical History:  Procedure Laterality Date   KNEE ARTHROSCOPY Left 1982    Social History   Socioeconomic History   Marital status: Married    Spouse name: Not on file   Number of children: Not on file   Years of education: Not on file   Highest education level: Not on file  Occupational History   Not on file  Tobacco Use   Smoking status: Never   Smokeless  tobacco: Never  Vaping Use   Vaping Use: Never used  Substance and Sexual Activity   Alcohol use: Not Currently   Drug use: Not on file   Sexual activity: Not on file  Other Topics Concern   Not on file  Social History Narrative   Not on file   Social Determinants of Health   Financial Resource Strain: Not on file  Food Insecurity: Not on file  Transportation Needs: Not on file  Physical Activity: Not on file  Stress: Not on file  Social Connections: Not on file    Family History  Problem Relation Age of Onset   Cancer Mother    Healthy Father     Health Maintenance  Topic Date Due   INFLUENZA VACCINE  05/29/2022 (Originally 09/28/2021)   PAP SMEAR-Modifier  08/29/2022 (Originally 12/17/1983)   COLONOSCOPY (Pts 45-49yr Insurance coverage will need to be confirmed)  10/21/2022 (Originally 12/17/2007)   Hepatitis C Screening  04/28/2023 (Originally 12/16/1980)   HIV Screening  04/28/2023 (Originally 12/16/1977)   COVID-19 Vaccine (1) 05/13/2023 (Originally [birth year]/1963)   Zoster Vaccines- Shingrix (1 of 2) 07/26/2023 (Originally 12/16/2012)   MAMMOGRAM  03/18/2023   DTaP/Tdap/Td (2 - Td or Tdap) 02/07/2031   HPV VACCINES  Aged Out     ----------------------------------------------------------------------------------------------------------------------------------------------------------------------------------------------------------------- Physical Exam BP 127/80 (BP Location: Left Arm, Patient Position: Sitting, Cuff Size: Normal)   Pulse 99   Ht '5\' 2"'$  (1.575 m)   Wt  151 lb (68.5 kg)   SpO2 99%   BMI 27.62 kg/m   Physical Exam Constitutional:      General: She is not in acute distress.    Appearance: Normal appearance.  HENT:     Head: Normocephalic and atraumatic.     Right Ear: Tympanic membrane and ear canal normal.     Left Ear: Tympanic membrane and ear canal normal.     Nose: Nose normal.  Eyes:     General: No scleral icterus.    Conjunctiva/sclera:  Conjunctivae normal.  Neck:     Thyroid: No thyromegaly.  Cardiovascular:     Rate and Rhythm: Normal rate and regular rhythm.     Heart sounds: Normal heart sounds.  Pulmonary:     Effort: Pulmonary effort is normal.     Breath sounds: Normal breath sounds.  Abdominal:     General: Bowel sounds are normal. There is no distension.     Palpations: Abdomen is soft.     Tenderness: There is no abdominal tenderness. There is no guarding.  Musculoskeletal:        General: Normal range of motion.     Cervical back: Normal range of motion and neck supple.  Lymphadenopathy:     Cervical: No cervical adenopathy.  Skin:    General: Skin is warm and dry.     Findings: No rash.  Neurological:     General: No focal deficit present.     Mental Status: She is alert and oriented to person, place, and time.     Cranial Nerves: No cranial nerve deficit.     Coordination: Coordination normal.  Psychiatric:        Mood and Affect: Mood normal.        Behavior: Behavior normal.     ------------------------------------------------------------------------------------------------------------------------------------------------------------------------------------------------------------------- Assessment and Plan  Well adult exam Well adult Orders Placed This Encounter  Procedures   COMPLETE METABOLIC PANEL WITH GFR   CBC with Differential   Lipid Panel w/reflex Direct LDL   TSH   Vitamin D (25 hydroxy)   B12   Iron, TIBC and Ferritin Panel   Ambulatory referral to Gastroenterology    Referral Priority:   Routine    Referral Type:   Consultation    Referral Reason:   Specialty Services Required    Number of Visits Requested:   1  Screenings: per lab orders. She will schedule with GYN and schedule mammogram.  Referral for colonoscopy entered.  Immunizations: Declines Anticipatory guidance/Risk factor reduction:  Recommendations per AVS.    Other fatigue Checking vitamin d, tsh, b12 and  ferritin levels.   No orders of the defined types were placed in this encounter.   No follow-ups on file.    This visit occurred during the SARS-CoV-2 public health emergency.  Safety protocols were in place, including screening questions prior to the visit, additional usage of staff PPE, and extensive cleaning of exam room while observing appropriate contact time as indicated for disinfecting solutions.

## 2022-04-27 NOTE — Assessment & Plan Note (Signed)
Well adult Orders Placed This Encounter  Procedures   COMPLETE METABOLIC PANEL WITH GFR   CBC with Differential   Lipid Panel w/reflex Direct LDL   TSH   Vitamin D (25 hydroxy)   B12   Iron, TIBC and Ferritin Panel   Ambulatory referral to Gastroenterology    Referral Priority:   Routine    Referral Type:   Consultation    Referral Reason:   Specialty Services Required    Number of Visits Requested:   1  Screenings: per lab orders. She will schedule with GYN and schedule mammogram.  Referral for colonoscopy entered.  Immunizations: Declines Anticipatory guidance/Risk factor reduction:  Recommendations per AVS.

## 2022-04-28 LAB — CBC WITH DIFFERENTIAL/PLATELET
Absolute Monocytes: 473 cells/uL (ref 200–950)
Basophils Absolute: 51 cells/uL (ref 0–200)
Basophils Relative: 0.9 %
Eosinophils Absolute: 160 cells/uL (ref 15–500)
Eosinophils Relative: 2.8 %
HCT: 45.1 % — ABNORMAL HIGH (ref 35.0–45.0)
Hemoglobin: 15 g/dL (ref 11.7–15.5)
Lymphs Abs: 1864 cells/uL (ref 850–3900)
MCH: 30.4 pg (ref 27.0–33.0)
MCHC: 33.3 g/dL (ref 32.0–36.0)
MCV: 91.5 fL (ref 80.0–100.0)
MPV: 11.2 fL (ref 7.5–12.5)
Monocytes Relative: 8.3 %
Neutro Abs: 3152 cells/uL (ref 1500–7800)
Neutrophils Relative %: 55.3 %
Platelets: 263 10*3/uL (ref 140–400)
RBC: 4.93 10*6/uL (ref 3.80–5.10)
RDW: 12.4 % (ref 11.0–15.0)
Total Lymphocyte: 32.7 %
WBC: 5.7 10*3/uL (ref 3.8–10.8)

## 2022-04-28 LAB — LIPID PANEL W/REFLEX DIRECT LDL
Cholesterol: 237 mg/dL — ABNORMAL HIGH (ref <200)
HDL: 84 mg/dL (ref >=50)
LDL Cholesterol (Calc): 135 mg/dL (calc) — ABNORMAL HIGH
Non-HDL Cholesterol (Calc): 153 mg/dL (calc) — ABNORMAL HIGH (ref <130)
Total CHOL/HDL Ratio: 2.8 (calc) (ref <5.0)
Triglycerides: 82 mg/dL (ref <150)

## 2022-04-28 LAB — COMPLETE METABOLIC PANEL WITH GFR
AG Ratio: 2 (calc) (ref 1.0–2.5)
ALT: 28 U/L (ref 6–29)
AST: 21 U/L (ref 10–35)
Albumin: 4.5 g/dL (ref 3.6–5.1)
Alkaline phosphatase (APISO): 73 U/L (ref 37–153)
BUN: 17 mg/dL (ref 7–25)
CO2: 30 mmol/L (ref 20–32)
Calcium: 9.8 mg/dL (ref 8.6–10.4)
Chloride: 104 mmol/L (ref 98–110)
Creat: 0.63 mg/dL (ref 0.50–1.03)
Globulin: 2.3 g/dL (calc) (ref 1.9–3.7)
Glucose, Bld: 94 mg/dL (ref 65–99)
Potassium: 4.7 mmol/L (ref 3.5–5.3)
Sodium: 141 mmol/L (ref 135–146)
Total Bilirubin: 0.5 mg/dL (ref 0.2–1.2)
Total Protein: 6.8 g/dL (ref 6.1–8.1)
eGFR: 102 mL/min/{1.73_m2} (ref >=60)

## 2022-04-28 LAB — VITAMIN D 25 HYDROXY (VIT D DEFICIENCY, FRACTURES): Vit D, 25-Hydroxy: 48 ng/mL (ref 30–100)

## 2022-04-28 LAB — IRON,TIBC AND FERRITIN PANEL
%SAT: 45 % (calc) (ref 16–45)
Ferritin: 33 ng/mL (ref 16–232)
Iron: 145 ug/dL (ref 45–160)
TIBC: 319 mcg/dL (calc) (ref 250–450)

## 2022-04-28 LAB — VITAMIN B12: Vitamin B-12: 624 pg/mL (ref 200–1100)

## 2022-04-28 LAB — TSH: TSH: 1.65 mIU/L (ref 0.40–4.50)

## 2022-04-29 ENCOUNTER — Encounter: Payer: Self-pay | Admitting: Family Medicine

## 2022-05-24 ENCOUNTER — Telehealth: Payer: Self-pay

## 2022-05-24 NOTE — Telephone Encounter (Signed)
Patient lvm stating she was not feeling well. Sinus issues.   Returned patient's call. She says symptoms have resolved.

## 2023-05-02 ENCOUNTER — Encounter: Payer: Managed Care, Other (non HMO) | Admitting: Family Medicine

## 2023-11-02 ENCOUNTER — Encounter: Payer: Self-pay | Admitting: Sports Medicine

## 2023-11-27 ENCOUNTER — Ambulatory Visit
Admission: EM | Admit: 2023-11-27 | Discharge: 2023-11-27 | Disposition: A | Discharge status: Home / Self Care | Attending: Emergency Medicine | Admitting: Emergency Medicine

## 2023-11-27 DIAGNOSIS — J019 Acute sinusitis, unspecified: Secondary | ICD-10-CM

## 2023-11-27 DIAGNOSIS — B9689 Other specified bacterial agents as the cause of diseases classified elsewhere: Secondary | ICD-10-CM | POA: Diagnosis not present

## 2023-11-27 MED ORDER — PROMETHAZINE-DM 6.25-15 MG/5ML PO SYRP: 5.0000 mL | ORAL_SOLUTION | Freq: Four times a day (QID) | ORAL | 0 refills | Status: AC | PRN

## 2023-11-27 MED ORDER — DOXYCYCLINE HYCLATE 100 MG PO CAPS: 100.0000 mg | ORAL_CAPSULE | Freq: Two times a day (BID) | ORAL | 0 refills | Status: AC

## 2023-11-27 NOTE — ED Triage Notes (Signed)
 Pt c/o cough and congestion x 10 days. Sudafed and mucinex prn.

## 2023-11-27 NOTE — ED Provider Notes (Signed)
 [facility]    CSN: [id number] Arrival date & time: 11/27/23  1622      History   Chief Complaint Chief Complaint  Patient presents with   Cough   Nasal Congestion    HPI Rhonda Wilkins is a 62 y.o. female.  10 days of nasal congestion and sinus pressure A few days ago started having dry cough No shortness of breath, wheezing, chest tightness No fever but felt chills Using mucinex and sudafed  Past Medical History:  Diagnosis Date   No pertinent past medical history     Patient Active Problem List   Diagnosis Date Noted   Other fatigue 04/27/2022   Mixed hyperlipidemia 10/20/2021   Vitamin D  deficiency 10/20/2021   Well adult exam 04/22/2021   Colon cancer screening 03/04/2021   Brittle nails 03/04/2021   DDD (degenerative disc disease), cervical 01/11/2021   Strain of abdominal muscle 02/07/2020   Nondisplaced fracture of the left third metatarsal base, comminuted fracture, nondisplaced of the left distal fibular shaft 02/04/2020    Past Surgical History:  Procedure Laterality Date   KNEE ARTHROSCOPY Left 1982    OB History   No obstetric history on file.      Home Medications    Prior to Admission medications   Medication Sig Start Date End Date Taking? Authorizing Provider  doxycycline (VIBRAMYCIN) 100 MG capsule Take 1 capsule (100 mg total) by mouth 2 (two) times daily for 7 days. 11/27/23 12/04/23 Yes Ledora Delker, [place], PA-C  promethazine-dextromethorphan (PROMETHAZINE-DM) 6.25-15 MG/5ML syrup Take 5 mLs by mouth 4 (four) times daily as needed for cough. 11/27/23  Yes Katelynd Blauvelt, [place], PA-C    Family History Family History  Problem Relation Age of Onset   Cancer Mother    Healthy Father     Social History Social History   Tobacco Use   Smoking status: Never   Smokeless tobacco: Never  Vaping Use   Vaping status: Never Used  Substance Use Topics   Alcohol use: Not Currently     Allergies   Latex   Review of  Systems Review of Systems  Respiratory:  Positive for cough.    As per HPI Physical Exam Triage Vital Signs ED Triage Vitals [11/27/23 1636]  Encounter Vitals Group     BP (!) 150/92     Girls Systolic BP Percentile      Girls Diastolic BP Percentile      Boys Systolic BP Percentile      Boys Diastolic BP Percentile      Pulse Rate 95     Resp 17     Temp 98.1 F (36.7 C)     Temp Source Oral     SpO2 96 %     Weight      Height      Head Circumference      Peak Flow      Pain Score 0     Pain Loc      Pain Education      Exclude from Growth Chart    No data found.  Updated Vital Signs BP (!) 150/92 (BP Location: Right Arm)   Pulse 95   Temp 98.1 F (36.7 C) (Oral)   Resp 17   SpO2 96%   Visual Acuity Right Eye Distance:   Left Eye Distance:   Bilateral Distance:    Right Eye Near:   Left Eye Near:    Bilateral Near:     Physical Exam Vitals and nursing  note reviewed.  Constitutional:      Appearance: She is not ill-appearing.  HENT:     Right Ear: Tympanic membrane and ear canal normal.     Left Ear: Tympanic membrane and ear canal normal.     Nose: Congestion present. No rhinorrhea.     Mouth/Throat:     Mouth: Mucous membranes are moist.     Pharynx: Oropharynx is clear. No posterior oropharyngeal erythema.  Eyes:     Conjunctiva/sclera: Conjunctivae normal.  Cardiovascular:     Rate and Rhythm: Normal rate and regular rhythm.     Pulses: Normal pulses.     Heart sounds: Normal heart sounds.  Pulmonary:     Effort: Pulmonary effort is normal. No respiratory distress.     Breath sounds: Normal breath sounds. No wheezing, rhonchi or rales.  Musculoskeletal:     Cervical back: Normal range of motion. No rigidity or tenderness.  Lymphadenopathy:     Cervical: No cervical adenopathy.  Skin:    General: Skin is warm and dry.  Neurological:     Mental Status: She is alert and oriented to person, place, and time.      UC Treatments / Results   Labs (all labs ordered are listed, but only abnormal results are displayed) Labs Reviewed - No data to display  EKG   Radiology No results found.  Procedures Procedures (including critical care time)  Medications Ordered in UC Medications - No data to display  Initial Impression / Assessment and Plan / UC Course  I have reviewed the triage vital signs and the nursing notes.  Pertinent labs & imaging results that were available during my care of the patient were reviewed by me and considered in my medical decision making (see chart for details).  Afebrile, well appearing  10 day history of nasal congestion, sinus pressure Cover for bacterial etiology with doxycycline BID x 7 days Dry cough -- clear lungs, sating 96% room air. Try promethazine DM, drowsy precautions. Advised return precautions. Agrees to plan, all questions answered   Final Clinical Impressions(s) / UC Diagnoses   Final diagnoses:  Acute bacterial sinusitis     Discharge Instructions      Please take medication doxycycline as prescribed. Take with food to avoid upset stomach. Finish all the pills!  The promethazine DM cough syrup can be used up to 4 times daily. If this medication makes you drowsy, take only once before bed.      ED Prescriptions     Medication Sig Dispense Auth. Provider   doxycycline (VIBRAMYCIN) 100 MG capsule Take 1 capsule (100 mg total) by mouth 2 (two) times daily for 7 days. 14 capsule Ahmoni Edge, PA-C   promethazine-dextromethorphan (PROMETHAZINE-DM) 6.25-15 MG/5ML syrup Take 5 mLs by mouth 4 (four) times daily as needed for cough. 240 mL Tsuyako Jolley, [place], PA-C      PDMP not reviewed this encounter.   Marlyne Totaro, [place], PA-C 11/27/23 1714

## 2023-11-27 NOTE — Discharge Instructions (Signed)
 Please take medication doxycycline as prescribed. Take with food to avoid upset stomach. Finish all the pills!  The promethazine DM cough syrup can be used up to 4 times daily. If this medication makes you drowsy, take only once before bed.
